# Patient Record
Sex: Female | Born: 2000 | Race: Black or African American | Hispanic: No | Marital: Single | Smoking: Never smoker
Health system: Southern US, Community
[De-identification: ages and names within clinical notes are randomized; demographics above are authoritative.]

## PROBLEM LIST (undated history)

## (undated) DIAGNOSIS — N390 Urinary tract infection, site not specified: Secondary | ICD-10-CM

---
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
## 2021-12-07 ENCOUNTER — Ambulatory Visit (HOSPITAL_COMMUNITY)
Admission: EM | Admit: 2021-12-07 | Discharge: 2021-12-07 | Disposition: A | Discharge status: Home / Self Care | Payer: 59 | Attending: Nurse Practitioner | Admitting: Nurse Practitioner

## 2021-12-07 DIAGNOSIS — F5101 Primary insomnia: Secondary | ICD-10-CM

## 2021-12-07 MED ORDER — HYDROXYZINE HCL 25 MG PO TABS: 25.0000 mg | ORAL_TABLET | Freq: Every evening | ORAL | 0 refills | Status: DC | PRN

## 2021-12-07 MED ORDER — HYDROXYZINE HCL 25 MG PO TABS: 25.0000 mg | ORAL_TABLET | Freq: Every evening | ORAL | Status: DC | PRN

## 2021-12-07 NOTE — BH Assessment (Signed)
Pt reports having diffuclty sleeping for the past month and on average she is sleeping about 3-4 hours a night. Pt reports she lost her great grandmother about a month ago. Patient reports she usually stays up at night thinking about how to better herself and she has thoughts about how her life is not what she wants it to be. Pt has not consulted with her PCP and she has tried taking melatonin to help her sleep. Pt denies SI, HI, AVH, SIB and substance use. Patient denies mental health history and she does not have outaptient services.  Patient mother is in the lobby for support.

## 2021-12-07 NOTE — ED Provider Notes (Signed)
Behavioral Health Urgent Care Medical Screening Exam  [name] MRN: [id number] Date of Evaluation: 12/07/21 Chief Complaint:   Diagnosis:  Final diagnoses:  Primary insomnia    History of Present illness: Carolyn Montgomery is a 21 y.o. female with no prior mental health diagnosis who arrived to this [place] [facility] urgent care with complaints of insomnia. Pt denies other depressive symptoms, states that her great grandmother died a month ago, but she feels as though she is dealing well with this. She states that this is not a stressor for her. 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 She is alert/oriented x 4; calm/cooperative; and mood congruent with affect.  She is speaking in a clear tone at moderate volume, and normal pace; with good eye contact.  Her thought process is coherent and relevant; There is no indication that she is currently responding to internal/external stimuli or experiencing delusional thought content; she has denied suicidal/self-harm/homicidal ideation, psychosis, and paranoia.  Patient has remained calm throughout assessment and has answered questions appropriately.  She denies any substance use/abuse, denies alcohol use, denies all recreational substance use.   Pt has been educated and verbalizes understanding of mental health resources and other crisis services in the community. She is instructed to call 911 and present to the nearest emergency room should she experience any suicidal/homicidal ideation, auditory/visual/hallucinations, or detrimental worsening of her mental health condition.  She has also been advised by Clinical research associate that she could call the toll-free phone on  insurance card to assist with identifying in network counselors and agencies or number on back of insurance card to speak with care coordinator.  Pt provided with a prescription for Vistaril for sleep with no refills, and educated on the need to walk back into this Behavioral center  to establish care for medication management. Pt educated co and establish care prior to the Vistaril running out. She has been educated to present to this center by 7 am on the day that she chooses, and to go to the second floor. Pt verbalized understanding and left accompanied by her mother.  Psychiatric Specialty Exam  Presentation  General Appearance:Appropriate for Environment  Eye Contact:Fair  Speech:Clear and Coherent  Speech Volume:Normal  Handedness:Right   Mood and Affect  Mood:Euthymic Affect:Congruent  Thought Process  Thought Processes:Coherent Descriptions of Associations:Intact  Orientation:Full (Time, Place and Person)  Thought Content:WDL    Hallucinations:None  Ideas of Reference:None  Suicidal Thoughts:No  Homicidal Thoughts:No   Sensorium  Memory:Immediate Good Judgment:Good Insight:Good  Executive Functions  Concentration:Good Attention Span:Good Recall:Good Fund of Knowledge:Good Language:Good  Psychomotor Activity  Psychomotor Activity:Normal  Assets  Assets:Communication Skills; Housing; Social Support  Sleep  Sleep:Poor Number of hours: No data recorded  No data recorded  Physical Exam: Physical Exam Constitutional:      Appearance: Normal appearance.  HENT:     Head: Normocephalic.  Eyes:     Pupils: Pupils are equal, round, and reactive to light.  Pulmonary:     Effort: Pulmonary effort is normal.  Musculoskeletal:        General: Normal range of motion.     Cervical back: Normal range of motion.  Neurological:  Mental Status: She is alert and oriented to person, place, and time.  Psychiatric:        Thought Content: Thought  content normal.    Review of Systems  Constitutional: Negative.   HENT: Negative.    Eyes: Negative.   Respiratory: Negative.    Cardiovascular: Negative.   Gastrointestinal: Negative.   Genitourinary: Negative.   Musculoskeletal: Negative.   Skin: Negative.   Neurological: Negative.   Endo/Heme/Allergies: Negative.   Psychiatric/Behavioral:  Negative for depression, hallucinations, memory loss, substance abuse and suicidal ideas. The patient has insomnia. The patient is not nervous/anxious.    Blood pressure (!) 143/92, pulse 60, temperature 99.5 F (37.5 C), temperature source Oral, resp. rate 18, SpO2 100 %. There is no height or weight on file to calculate BMI.  Musculoskeletal: Strength & Muscle Tone: within normal limits Gait & Station: normal Patient leans: N/A  [facility] MSE Discharge Disposition for Follow up and Recommendations: Based on my evaluation the patient does not appear to have an emergency medical condition and can be discharged with resources and follow up care in outpatient services for Medication Management  Starleen Blue, NP 12/07/2021, 9:38 AM

## 2021-12-07 NOTE — Discharge Instructions (Signed)
On day of arrival, be here by 7 am and go upstairs to the second floor and get a clip board and wait to be seen. Patients are seen on a first come, first served basis.

## 2023-01-10 ENCOUNTER — Emergency Department (HOSPITAL_BASED_OUTPATIENT_CLINIC_OR_DEPARTMENT_OTHER): Payer: Commercial Managed Care - HMO

## 2023-01-10 ENCOUNTER — Other Ambulatory Visit: Payer: Self-pay

## 2023-01-10 ENCOUNTER — Encounter (HOSPITAL_BASED_OUTPATIENT_CLINIC_OR_DEPARTMENT_OTHER): Payer: Self-pay

## 2023-01-10 ENCOUNTER — Inpatient Hospital Stay (HOSPITAL_BASED_OUTPATIENT_CLINIC_OR_DEPARTMENT_OTHER)
Admission: EM | Admit: 2023-01-10 | Discharge: 2023-01-15 | DRG: 444 | Disposition: A | Discharge status: Home / Self Care | Payer: Commercial Managed Care - HMO | Attending: Internal Medicine | Admitting: Internal Medicine

## 2023-01-10 DIAGNOSIS — Z1152 Encounter for screening for COVID-19: Secondary | ICD-10-CM

## 2023-01-10 DIAGNOSIS — J45909 Unspecified asthma, uncomplicated: Secondary | ICD-10-CM | POA: Diagnosis present

## 2023-01-10 DIAGNOSIS — E860 Dehydration: Secondary | ICD-10-CM | POA: Diagnosis present

## 2023-01-10 DIAGNOSIS — I959 Hypotension, unspecified: Secondary | ICD-10-CM | POA: Diagnosis present

## 2023-01-10 DIAGNOSIS — E876 Hypokalemia: Secondary | ICD-10-CM | POA: Diagnosis present

## 2023-01-10 DIAGNOSIS — K8020 Calculus of gallbladder without cholecystitis without obstruction: Principal | ICD-10-CM | POA: Diagnosis present

## 2023-01-10 DIAGNOSIS — R748 Abnormal levels of other serum enzymes: Secondary | ICD-10-CM | POA: Insufficient documentation

## 2023-01-10 DIAGNOSIS — Z8249 Family history of ischemic heart disease and other diseases of the circulatory system: Secondary | ICD-10-CM

## 2023-01-10 DIAGNOSIS — F1290 Cannabis use, unspecified, uncomplicated: Secondary | ICD-10-CM | POA: Diagnosis present

## 2023-01-10 DIAGNOSIS — E871 Hypo-osmolality and hyponatremia: Secondary | ICD-10-CM | POA: Diagnosis present

## 2023-01-10 DIAGNOSIS — E874 Mixed disorder of acid-base balance: Secondary | ICD-10-CM | POA: Diagnosis present

## 2023-01-10 DIAGNOSIS — Z23 Encounter for immunization: Secondary | ICD-10-CM

## 2023-01-10 DIAGNOSIS — F4320 Adjustment disorder, unspecified: Secondary | ICD-10-CM | POA: Diagnosis present

## 2023-01-10 DIAGNOSIS — D72829 Elevated white blood cell count, unspecified: Secondary | ICD-10-CM

## 2023-01-10 DIAGNOSIS — R7401 Elevation of levels of liver transaminase levels: Secondary | ICD-10-CM | POA: Diagnosis present

## 2023-01-10 DIAGNOSIS — Z6831 Body mass index (BMI) 31.0-31.9, adult: Secondary | ICD-10-CM

## 2023-01-10 DIAGNOSIS — E872 Acidosis, unspecified: Secondary | ICD-10-CM | POA: Insufficient documentation

## 2023-01-10 DIAGNOSIS — E878 Other disorders of electrolyte and fluid balance, not elsewhere classified: Secondary | ICD-10-CM | POA: Diagnosis present

## 2023-01-10 DIAGNOSIS — A419 Sepsis, unspecified organism: Secondary | ICD-10-CM

## 2023-01-10 DIAGNOSIS — R112 Nausea with vomiting, unspecified: Secondary | ICD-10-CM | POA: Diagnosis present

## 2023-01-10 DIAGNOSIS — K3189 Other diseases of stomach and duodenum: Secondary | ICD-10-CM | POA: Diagnosis present

## 2023-01-10 DIAGNOSIS — K859 Acute pancreatitis without necrosis or infection, unspecified: Secondary | ICD-10-CM | POA: Diagnosis present

## 2023-01-10 DIAGNOSIS — E873 Alkalosis: Secondary | ICD-10-CM

## 2023-01-10 DIAGNOSIS — N179 Acute kidney failure, unspecified: Principal | ICD-10-CM | POA: Diagnosis present

## 2023-01-10 DIAGNOSIS — E669 Obesity, unspecified: Secondary | ICD-10-CM | POA: Diagnosis present

## 2023-01-10 DIAGNOSIS — R9431 Abnormal electrocardiogram [ECG] [EKG]: Secondary | ICD-10-CM | POA: Diagnosis present

## 2023-01-10 LAB — I-STAT VENOUS BLOOD GAS, ED
Acid-Base Excess: 10 mmol/L — ABNORMAL HIGH (ref 0.0–2.0)
Bicarbonate: 30.2 mmol/L — ABNORMAL HIGH (ref 20.0–28.0)
Calcium, Ion: 1.11 mmol/L — ABNORMAL LOW (ref 1.15–1.40)
HCT: 54 % — ABNORMAL HIGH (ref 36.0–46.0)
Hemoglobin: 18.4 g/dL — ABNORMAL HIGH (ref 12.0–15.0)
O2 Saturation: 82 %
Potassium: 2.6 mmol/L — CL (ref 3.5–5.1)
Sodium: 134 mmol/L — ABNORMAL LOW (ref 135–145)
TCO2: 31 mmol/L (ref 22–32)
pCO2, Ven: 28.1 mm[Hg] — ABNORMAL LOW (ref 44–60)
pH, Ven: 7.639 (ref 7.25–7.43)
pO2, Ven: 36 mm[Hg] (ref 32–45)

## 2023-01-10 LAB — URINALYSIS, W/ REFLEX TO CULTURE (INFECTION SUSPECTED)
Bilirubin Urine: NEGATIVE
Glucose, UA: NEGATIVE mg/dL
Hgb urine dipstick: NEGATIVE
Ketones, ur: 15 mg/dL — AB
Leukocytes,Ua: NEGATIVE
Nitrite: NEGATIVE
Protein, ur: 30 mg/dL — AB
Specific Gravity, Urine: 1.026 (ref 1.005–1.030)
pH: 6.5 (ref 5.0–8.0)

## 2023-01-10 LAB — CBC
HCT: 48.6 % — ABNORMAL HIGH (ref 36.0–46.0)
Hemoglobin: 17.5 g/dL — ABNORMAL HIGH (ref 12.0–15.0)
MCH: 31.6 pg (ref 26.0–34.0)
MCHC: 36 g/dL (ref 30.0–36.0)
MCV: 87.7 fL (ref 80.0–100.0)
Platelets: 425 10*3/uL — ABNORMAL HIGH (ref 150–400)
RBC: 5.54 MIL/uL — ABNORMAL HIGH (ref 3.87–5.11)
RDW: 17.1 % — ABNORMAL HIGH (ref 11.5–15.5)
WBC: 11.5 10*3/uL — ABNORMAL HIGH (ref 4.0–10.5)
nRBC: 0 % (ref 0.0–0.2)

## 2023-01-10 LAB — APTT: aPTT: 27 s (ref 24–36)

## 2023-01-10 LAB — COMPREHENSIVE METABOLIC PANEL
ALT: 311 U/L — ABNORMAL HIGH (ref 0–44)
AST: 109 U/L — ABNORMAL HIGH (ref 15–41)
Albumin: 4.9 g/dL (ref 3.5–5.0)
Alkaline Phosphatase: 69 U/L (ref 38–126)
Anion gap: 23 — ABNORMAL HIGH (ref 5–15)
BUN: 24 mg/dL — ABNORMAL HIGH (ref 6–20)
CO2: 22 mmol/L (ref 22–32)
Calcium: 10.6 mg/dL — ABNORMAL HIGH (ref 8.9–10.3)
Chloride: 88 mmol/L — ABNORMAL LOW (ref 98–111)
Creatinine, Ser: 1.63 mg/dL — ABNORMAL HIGH (ref 0.44–1.00)
GFR, Estimated: 45 mL/min — ABNORMAL LOW (ref >60)
Glucose, Bld: 165 mg/dL — ABNORMAL HIGH (ref 70–99)
Potassium: 2.7 mmol/L — CL (ref 3.5–5.1)
Sodium: 133 mmol/L — ABNORMAL LOW (ref 135–145)
Total Bilirubin: 0.9 mg/dL (ref 0.3–1.2)
Total Protein: 8.8 g/dL — ABNORMAL HIGH (ref 6.5–8.1)

## 2023-01-10 LAB — LACTIC ACID, PLASMA
Lactic Acid, Venous: 1.4 mmol/L (ref 0.5–1.9)
Lactic Acid, Venous: 2.6 mmol/L (ref 0.5–1.9)

## 2023-01-10 LAB — SARS CORONAVIRUS 2 BY RT PCR: SARS Coronavirus 2 by RT PCR: NEGATIVE

## 2023-01-10 LAB — MAGNESIUM: Magnesium: 2.7 mg/dL — ABNORMAL HIGH (ref 1.7–2.4)

## 2023-01-10 LAB — LIPASE, BLOOD: Lipase: 261 U/L — ABNORMAL HIGH (ref 11–51)

## 2023-01-10 LAB — PROTIME-INR
INR: 1 (ref 0.8–1.2)
Prothrombin Time: 13.1 s (ref 11.4–15.2)

## 2023-01-10 LAB — RESP PANEL BY RT-PCR (RSV, FLU A&B, COVID)  RVPGX2
Influenza A by PCR: NEGATIVE
Influenza B by PCR: NEGATIVE
Resp Syncytial Virus by PCR: NEGATIVE
SARS Coronavirus 2 by RT PCR: NEGATIVE

## 2023-01-10 LAB — HCG, SERUM, QUALITATIVE: Preg, Serum: NEGATIVE

## 2023-01-10 LAB — ACETAMINOPHEN LEVEL: Acetaminophen (Tylenol), Serum: <10 ug/mL — ABNORMAL LOW (ref 10–30)

## 2023-01-10 LAB — ETHANOL: Alcohol, Ethyl (B): <10 mg/dL (ref <10)

## 2023-01-10 LAB — SALICYLATE LEVEL: Salicylate Lvl: <7 mg/dL — ABNORMAL LOW (ref 7.0–30.0)

## 2023-01-10 LAB — TSH: TSH: 0.727 u[IU]/mL (ref 0.350–4.500)

## 2023-01-10 MED ORDER — LACTATED RINGERS IV SOLN: INTRAVENOUS | Status: DC

## 2023-01-10 MED ORDER — LACTATED RINGERS IV BOLUS
1000.0000 mL | Freq: Once | INTRAVENOUS | Status: AC
  Administered 2023-01-10: 1000 mL via INTRAVENOUS

## 2023-01-10 MED ORDER — METRONIDAZOLE 500 MG/100ML IV SOLN
500.0000 mg | Freq: Once | INTRAVENOUS | Status: AC
  Administered 2023-01-10: 500 mg via INTRAVENOUS
  Filled 2023-01-10: qty 100

## 2023-01-10 MED ORDER — FAMOTIDINE IN NACL 20-0.9 MG/50ML-% IV SOLN
20.0000 mg | Freq: Once | INTRAVENOUS | Status: AC
  Administered 2023-01-10: 20 mg via INTRAVENOUS
  Filled 2023-01-10: qty 50

## 2023-01-10 MED ORDER — SODIUM CHLORIDE 0.9 % IV SOLN
2.0000 g | Freq: Once | INTRAVENOUS | Status: AC
  Administered 2023-01-10: 2 g via INTRAVENOUS
  Filled 2023-01-10: qty 20

## 2023-01-10 MED ORDER — IOHEXOL 300 MG/ML  SOLN
100.0000 mL | Freq: Once | INTRAMUSCULAR | Status: AC | PRN
  Administered 2023-01-10: 100 mL via INTRAVENOUS

## 2023-01-10 MED ORDER — ONDANSETRON 4 MG PO TBDP
4.0000 mg | ORAL_TABLET | Freq: Once | ORAL | Status: AC | PRN
  Administered 2023-01-10: 4 mg via ORAL
  Filled 2023-01-10: qty 1

## 2023-01-10 MED ORDER — LACTATED RINGERS IV BOLUS (SEPSIS)
1000.0000 mL | Freq: Once | INTRAVENOUS | Status: AC
  Administered 2023-01-10: 1000 mL via INTRAVENOUS

## 2023-01-10 MED ORDER — ONDANSETRON HCL 4 MG/2ML IJ SOLN
4.0000 mg | Freq: Once | INTRAMUSCULAR | Status: AC
  Administered 2023-01-10: 4 mg via INTRAVENOUS
  Filled 2023-01-10: qty 2

## 2023-01-10 MED ORDER — POTASSIUM CHLORIDE 10 MEQ/100ML IV SOLN
10.0000 meq | INTRAVENOUS | Status: AC
  Administered 2023-01-10 – 2023-01-11 (×5): 10 meq via INTRAVENOUS
  Filled 2023-01-10 (×5): qty 100

## 2023-01-10 NOTE — ED Notes (Signed)
RT Note: VBG completed and handed to Dr. Dalene Seltzer.

## 2023-01-10 NOTE — ED Triage Notes (Signed)
Pt presents with N/V, decreased appetite, and fatigue x 1 week. Pt has taken PeptoBismol without relief.

## 2023-01-10 NOTE — ED Notes (Signed)
Due to sepsis protocol, imaging was requested to remain on hold for nursing staff to perform necessary actions. Will perform imaging after nurse gives all clear.

## 2023-01-10 NOTE — Plan of Care (Signed)
Plan of Care Note for accepted transfer   [name] ZOX:[id number] DOB: 12-19-2000  Facility requesting transfer: [facility] ED Requesting Provider: Dr. Dalene Seltzer Facility course: 22 year old female with history of asthma, adjustment disorder presented to ED with complaints of nausea, vomiting, and epigastric abdominal pain.  Tachycardic on arrival but afebrile.  Labs showing WBC 11.5, hemoglobin 17.5, platelet count 425k, sodium 133, potassium 2.7, magnesium 2.7, chloride 88, glucose 165, BUN 24, creatinine 1.6 (baseline 1.0), calcium 10.6, albumin 4.9, AST 109, ALT 311, alk phos and T. bili normal, lipase 261, COVID/influenza/RSV PCR negative, blood cultures collected, lactic acid 1.4> 2.6, salicylate level <7.0, acetaminophen level <10, blood alcohol level <10, serum pregnancy test negative, TSH normal.  UA with negative nitrite, negative leukocytes, and microscopy showing 11-20 WBCs and rare bacteria.  VBG showing pH 7.63 and pCO2 28.  Chest x-ray showing no active disease.  Right upper quadrant abdominal ultrasound showing cholelithiasis without sonographic evidence of acute cholecystitis.  CT abdomen pelvis showing gallstones and no acute abnormality.  EKG showing QT prolongation. Patient was given Zofran, ceftriaxone, metronidazole, Pepcid, 4 L IV fluid boluses, and IV potassium in the ED.  Plan of care: The patient is accepted for admission to Progressive unit at [facility].  [facility] will assume care on arrival to accepting facility. Until arrival, care as per EDP. However, [facility] available 24/7 for questions and assistance.  Author: John Giovanni, MD 11/18/2022  Check [email or web address] for on-call coverage.  Nursing staff, please call [facility] Admits & Consults System-Wide number under Amion on patient's arrival so appropriate admitting provider can evaluate the pt.

## 2023-01-10 NOTE — Sepsis Progress Note (Signed)
Code Sepsis protocol being monitored by eLink. 

## 2023-01-10 NOTE — Sepsis Progress Note (Signed)
Notified provider of need to order 3rd lactic acid.

## 2023-01-10 NOTE — ED Provider Notes (Signed)
[place] EMERGENCY DEPARTMENT AT [facility] Provider Note   CSN: [id number] Arrival date & time: 01/10/23  1535     History  No chief complaint on file.   Carolyn Montgomery is a 22 y.o. female history of adjustment disorder presented for nausea and vomiting over the past 1 week.  Mom is present to assist in history.  Both state the patient has had nonstop emesis and has not been eating due to the emesis.  Mom states that patient sleeps all the time and has not appeared energetic.  Patient is tried Pepto-Bismol without relief along with Tylenol and ibuprofen but says she is only taken a few pills of these.  Patient that she has not had alcohol in a few weeks.  Patient denies illicit drug use.  Patient denies sick contacts.  Patient denies any diarrhea.  Patient is unable to say whether or not she has abdominal pain.  Patient denies chest pain, shortness of breath, dysuria, vaginal bleeding, neck stiffness, headache  Home Medications Prior to Admission medications   Medication Sig Start Date End Date Taking? Authorizing Provider  hydrOXYzine (ATARAX) 25 MG tablet Take 1 tablet (25 mg total) by mouth at bedtime as needed (insomnia). 12/07/21   Starleen Blue, NP      Allergies    Patient has no known allergies.    Review of Systems   Review of Systems  Physical Exam Updated Vital Signs BP 117/79   Pulse 83   Temp 98.8 F (37.1 C) (Oral)   Resp 17   Ht 5' 0.5" (1.537 m)   Wt 86.2 kg   LMP  (LMP Unknown)   SpO2 98%   BMI 36.50 kg/m  Physical Exam Constitutional:      General: She is in acute distress.     Appearance: She is ill-appearing and toxic-appearing.     Comments: Lethargic  HENT:     Mouth/Throat:     Mouth: Mucous membranes are dry.  Eyes:     General: No scleral icterus.    Extraocular Movements: Extraocular movements intact.     Conjunctiva/sclera: Conjunctivae normal.     Pupils: Pupils are equal, round, and reactive to light.  Cardiovascular:      Rate and Rhythm: Regular rhythm. Tachycardia present.     Pulses: Normal pulses.     Heart sounds: Normal heart sounds.  Pulmonary:     Effort: Pulmonary effort is normal.     Breath sounds: Normal breath sounds.  Abdominal:     General: There is no distension.     Palpations: Abdomen is soft.     Tenderness: There is no abdominal tenderness. There is no guarding or rebound.  Musculoskeletal:     Cervical back: Normal range of motion. No rigidity or tenderness.     Comments: Able to move all 4 extremities however does appear weak  Skin:    General: Skin is warm and dry.     Capillary Refill: Capillary refill takes 2 to 3 seconds.     Coloration: Skin is not jaundiced.  Neurological:     Mental Status: She is alert.     Comments: Appears lethargic Sensation intact in all 4 limbs     ED Results / Procedures / Treatments   Labs (all labs ordered are listed, but only abnormal results are displayed) Labs Reviewed  LIPASE, BLOOD - Abnormal; Notable for the following components:      Result Value   Lipase 261 (*)  All other components within normal limits  COMPREHENSIVE METABOLIC PANEL - Abnormal; Notable for the following components:   Sodium 133 (*)    Potassium 2.7 (*)    Chloride 88 (*)    Glucose, Bld 165 (*)    BUN 24 (*)    Creatinine, Ser 1.63 (*)    Calcium 10.6 (*)    Total Protein 8.8 (*)    AST 109 (*)    ALT 311 (*)    GFR, Estimated 45 (*)    Anion gap 23 (*)    All other components within normal limits  CBC - Abnormal; Notable for the following components:   WBC 11.5 (*)    RBC 5.54 (*)    Hemoglobin 17.5 (*)    HCT 48.6 (*)    RDW 17.1 (*)    Platelets 425 (*)    All other components within normal limits  SALICYLATE LEVEL - Abnormal; Notable for the following components:   Salicylate Lvl <7.0 (*)    All other components within normal limits  ACETAMINOPHEN LEVEL - Abnormal; Notable for the following components:   Acetaminophen (Tylenol), Serum  <10 (*)    All other components within normal limits  I-STAT VENOUS BLOOD GAS, ED - Abnormal; Notable for the following components:   pH, Ven 7.639 (*)    pCO2, Ven 28.1 (*)    Bicarbonate 30.2 (*)    Acid-Base Excess 10.0 (*)    Sodium 134 (*)    Potassium 2.6 (*)    Calcium, Ion 1.11 (*)    HCT 54.0 (*)    Hemoglobin 18.4 (*)    All other components within normal limits  SARS CORONAVIRUS 2 BY RT PCR  RESP PANEL BY RT-PCR (RSV, FLU A&B, COVID)  RVPGX2  CULTURE, BLOOD (ROUTINE X 2)  CULTURE, BLOOD (ROUTINE X 2)  LACTIC ACID, PLASMA  PROTIME-INR  APTT  ETHANOL  HCG, SERUM, QUALITATIVE  PREGNANCY, URINE  LACTIC ACID, PLASMA  URINALYSIS, W/ REFLEX TO CULTURE (INFECTION SUSPECTED)  MAGNESIUM    EKG EKG Interpretation Date/Time:  Friday January 10 2023 16:48:53 EDT Ventricular Rate:  59 PR Interval:  115 QRS Duration:  110 QT Interval:  543 QTC Calculation: 538 R Axis:   61  Text Interpretation: Sinus rhythm Borderline short PR interval Prolonged QT interval Nonspecific ST changes Confirmed by Alvira Monday ([id number]) on 01/10/2023 5:57:14 PM  Radiology DG Chest Port 1 View  Result Date: 01/10/2023 CLINICAL DATA:  Possible sepsis EXAM: PORTABLE CHEST 1 VIEW COMPARISON:  12/15/2011 FINDINGS: The heart size and mediastinal contours are within normal limits. Both lungs are clear. The visualized skeletal structures are unremarkable. IMPRESSION: No active disease. Electronically Signed   By: Jasmine Pang M.D.   On: 01/10/2023 18:30    Procedures .Critical Care  Performed by: Netta Corrigan, PA-C Authorized by: Netta Corrigan, PA-C   Critical care provider statement:    Critical care time (minutes):  30   Critical care was necessary to treat or prevent imminent or life-threatening deterioration of the following conditions:  Sepsis   Critical care was time spent personally by me on the following activities:  Development of treatment plan with patient or surrogate,  discussions with consultants, evaluation of patient's response to treatment, examination of patient, ordering and review of laboratory studies, ordering and review of radiographic studies, ordering and performing treatments and interventions, pulse oximetry, re-evaluation of patient's condition, review of old charts, blood draw for specimens and obtaining history from patient or  surrogate   I assumed direction of critical care for this patient from another provider in my specialty: no     Care discussed with comment:  Sign out to next provider     Medications Ordered in ED Medications  lactated ringers infusion (has no administration in time range)  lactated ringers bolus 1,000 mL (1,000 mLs Intravenous New Bag/Given 01/10/23 1805)    And  lactated ringers bolus 1,000 mL (1,000 mLs Intravenous New Bag/Given 01/10/23 1806)    And  lactated ringers bolus 1,000 mL (has no administration in time range)  metroNIDAZOLE (FLAGYL) IVPB 500 mg (500 mg Intravenous New Bag/Given 01/10/23 1805)  famotidine (PEPCID) IVPB 20 mg premix (20 mg Intravenous New Bag/Given 01/10/23 1832)  potassium chloride 10 mEq in 100 mL IVPB (10 mEq Intravenous New Bag/Given 01/10/23 1832)  ondansetron (ZOFRAN-ODT) disintegrating tablet 4 mg (4 mg Oral Given 01/10/23 1548)  cefTRIAXone (ROCEPHIN) 2 g in sodium chloride 0.9 % 100 mL IVPB (0 g Intravenous Stopped 01/10/23 1835)  ondansetron (ZOFRAN) injection 4 mg (4 mg Intravenous Given 01/10/23 1824)    ED Course/ Medical Decision Making/ A&P                                 Medical Decision Making Amount and/or Complexity of Data Reviewed Labs: ordered. Radiology: ordered. ECG/medicine tests: ordered.  Risk Prescription drug management.   Carolyn Montgomery 21 y.o. presented today for sepsis.  Working DDx that I considered at this time includes, but not limited to, sepsis, bacteremia, UTI, pneumonia, meningitis/encephalitis, cellulitis, ACS, myocarditis, acidosis,  dehydration, electrolyte abnormalities.  R/o DDx: Pending  Review of prior external notes: 12/07/2021 ED  Unique Tests and My Interpretation: CBC: Leukocytosis 11.5 CMP: Hypokalemia 2.7, chloride 88, sodium 133, creatinine 1.63, BUN 24, transaminitis 109/311, GFR 45, anion gap 23 Lactic acid: 1.4 Magnesium: pending Tylenol: Unremarkable Salicylate level:Unremarkable Ethanol:Unremarkable Lipase: 261 UA: Pending Chest x-ray: No acute cardiopulmonary changes aPTT: Unremarkable PT/INR: Unremarkable Blood cultures: Pending Respiratory panel: Negative EKG: Sinus 59 bpm, prolonged QT 543/538  Discussion with Independent Historian:  Mother  Discussion of Management of Tests: None  Risk: High: Hospitalization  Risk Stratification Score: none  Plan: On exam patient was septic on arrival.  On exam patient appeared lethargic and was tachycardic at 107.  Patient did appear dry on exam as well which I suspect is from all of her nausea and vomiting.  Patient did not have any abdominal tenderness on exam and did not endorse history of right upper quadrant or right lower quadrant pain but abdominal labs from triage do show a lipase of 261 along with transaminitis of 109/311 along with leukocytosis 11.5.  Patient was also severely hypokalemic at 2.7 and will be replenished through the IV as she cannot tolerate p.o. at this time.  Patient does have AKI as well along with elevated anion gap which I suspect is secondary to her dehydration however patient does endorse using Pepto-Bismol and so salicylate level and Tylenol levels were ordered along with ethanol given patient's history.  Sepsis workup was initiated and ultrasound be ordered to look at patient's gallbladder if negative will get CT to further rule out other pathology however suspect patient is severely dehydrated and will need admission.  Patient was having active emesis that was nonbloody in the room and although she was given Zofran in  triage will give Zofran through the IV to help.  EKG came back  and patient does have prolonged QT and so will withhold further QT prolonging medications.  May consider Tigan if nausea and emesis does not improve.  Patient does have metabolic alkalosis which I suspect is secondary to her emesis and contributing to her weakness and fatigue.  Patient's vitals have improved with fluids and patient is no longer tachycardic.  Patient signed out to [facility], PA-C.  Please review their note for the continuation of patient's care.  The plan at this point is admit for AKI and to follow-up on ultrasound and CT.  This chart was dictated using voice recognition software.  Despite best efforts to proofread,  errors can occur which can change the documentation meaning.         Final Clinical Impression(s) / ED Diagnoses Final diagnoses:  AKI (acute kidney injury) (HCC)  Metabolic alkalosis    Rx / DC Orders ED Discharge Orders     None         Remi Deter 01/10/23 1839    Alvira Monday, MD 01/11/23 [phone]

## 2023-01-10 NOTE — ED Notes (Signed)
Pt is unable to give a urine sample, will try to ask again at a later time.

## 2023-01-11 ENCOUNTER — Encounter (HOSPITAL_COMMUNITY): Payer: Self-pay | Admitting: Internal Medicine

## 2023-01-11 DIAGNOSIS — E860 Dehydration: Secondary | ICD-10-CM | POA: Diagnosis not present

## 2023-01-11 DIAGNOSIS — R748 Abnormal levels of other serum enzymes: Secondary | ICD-10-CM | POA: Insufficient documentation

## 2023-01-11 DIAGNOSIS — I959 Hypotension, unspecified: Secondary | ICD-10-CM | POA: Diagnosis not present

## 2023-01-11 DIAGNOSIS — R7401 Elevation of levels of liver transaminase levels: Secondary | ICD-10-CM | POA: Diagnosis not present

## 2023-01-11 DIAGNOSIS — J45909 Unspecified asthma, uncomplicated: Secondary | ICD-10-CM | POA: Diagnosis not present

## 2023-01-11 DIAGNOSIS — Z6831 Body mass index (BMI) 31.0-31.9, adult: Secondary | ICD-10-CM | POA: Diagnosis not present

## 2023-01-11 DIAGNOSIS — Z8249 Family history of ischemic heart disease and other diseases of the circulatory system: Secondary | ICD-10-CM | POA: Diagnosis not present

## 2023-01-11 DIAGNOSIS — E874 Mixed disorder of acid-base balance: Secondary | ICD-10-CM | POA: Diagnosis not present

## 2023-01-11 DIAGNOSIS — Z23 Encounter for immunization: Secondary | ICD-10-CM | POA: Diagnosis not present

## 2023-01-11 DIAGNOSIS — E871 Hypo-osmolality and hyponatremia: Secondary | ICD-10-CM | POA: Diagnosis not present

## 2023-01-11 DIAGNOSIS — D72829 Elevated white blood cell count, unspecified: Secondary | ICD-10-CM

## 2023-01-11 DIAGNOSIS — E873 Alkalosis: Secondary | ICD-10-CM

## 2023-01-11 DIAGNOSIS — N179 Acute kidney failure, unspecified: Secondary | ICD-10-CM | POA: Diagnosis not present

## 2023-01-11 DIAGNOSIS — R9431 Abnormal electrocardiogram [ECG] [EKG]: Secondary | ICD-10-CM | POA: Diagnosis not present

## 2023-01-11 DIAGNOSIS — E878 Other disorders of electrolyte and fluid balance, not elsewhere classified: Secondary | ICD-10-CM | POA: Insufficient documentation

## 2023-01-11 DIAGNOSIS — E872 Acidosis, unspecified: Secondary | ICD-10-CM | POA: Insufficient documentation

## 2023-01-11 DIAGNOSIS — K3189 Other diseases of stomach and duodenum: Secondary | ICD-10-CM | POA: Diagnosis not present

## 2023-01-11 DIAGNOSIS — A419 Sepsis, unspecified organism: Secondary | ICD-10-CM

## 2023-01-11 DIAGNOSIS — E876 Hypokalemia: Secondary | ICD-10-CM | POA: Diagnosis not present

## 2023-01-11 DIAGNOSIS — Z1152 Encounter for screening for COVID-19: Secondary | ICD-10-CM | POA: Diagnosis not present

## 2023-01-11 DIAGNOSIS — R112 Nausea with vomiting, unspecified: Secondary | ICD-10-CM | POA: Diagnosis not present

## 2023-01-11 DIAGNOSIS — K859 Acute pancreatitis without necrosis or infection, unspecified: Secondary | ICD-10-CM | POA: Diagnosis not present

## 2023-01-11 DIAGNOSIS — K8020 Calculus of gallbladder without cholecystitis without obstruction: Secondary | ICD-10-CM | POA: Diagnosis not present

## 2023-01-11 DIAGNOSIS — F1290 Cannabis use, unspecified, uncomplicated: Secondary | ICD-10-CM | POA: Diagnosis not present

## 2023-01-11 DIAGNOSIS — E669 Obesity, unspecified: Secondary | ICD-10-CM | POA: Diagnosis not present

## 2023-01-11 DIAGNOSIS — F4320 Adjustment disorder, unspecified: Secondary | ICD-10-CM | POA: Diagnosis not present

## 2023-01-11 LAB — CBC
HCT: 34.8 % — ABNORMAL LOW (ref 36.0–46.0)
Hemoglobin: 11.8 g/dL — ABNORMAL LOW (ref 12.0–15.0)
MCH: 31.3 pg (ref 26.0–34.0)
MCHC: 33.9 g/dL (ref 30.0–36.0)
MCV: 92.3 fL (ref 80.0–100.0)
Platelets: 213 10*3/uL (ref 150–400)
RBC: 3.77 MIL/uL — ABNORMAL LOW (ref 3.87–5.11)
RDW: 16.7 % — ABNORMAL HIGH (ref 11.5–15.5)
WBC: 8.1 10*3/uL (ref 4.0–10.5)
nRBC: 0 % (ref 0.0–0.2)

## 2023-01-11 LAB — COMPREHENSIVE METABOLIC PANEL
ALT: 171 U/L — ABNORMAL HIGH (ref 0–44)
AST: 58 U/L — ABNORMAL HIGH (ref 15–41)
Albumin: 2.9 g/dL — ABNORMAL LOW (ref 3.5–5.0)
Alkaline Phosphatase: 45 U/L (ref 38–126)
Anion gap: 11 (ref 5–15)
BUN: 16 mg/dL (ref 6–20)
CO2: 26 mmol/L (ref 22–32)
Calcium: 8.5 mg/dL — ABNORMAL LOW (ref 8.9–10.3)
Chloride: 97 mmol/L — ABNORMAL LOW (ref 98–111)
Creatinine, Ser: 1.14 mg/dL — ABNORMAL HIGH (ref 0.44–1.00)
GFR, Estimated: >60 mL/min (ref >60)
Glucose, Bld: 106 mg/dL — ABNORMAL HIGH (ref 70–99)
Potassium: 2.6 mmol/L — CL (ref 3.5–5.1)
Sodium: 134 mmol/L — ABNORMAL LOW (ref 135–145)
Total Bilirubin: 0.8 mg/dL (ref 0.3–1.2)
Total Protein: 5.5 g/dL — ABNORMAL LOW (ref 6.5–8.1)

## 2023-01-11 LAB — RAPID URINE DRUG SCREEN, HOSP PERFORMED
Amphetamines: NOT DETECTED
Barbiturates: NOT DETECTED
Benzodiazepines: NOT DETECTED
Cocaine: NOT DETECTED
Opiates: NOT DETECTED
Tetrahydrocannabinol: POSITIVE — AB

## 2023-01-11 LAB — T4, FREE: Free T4: 1.28 ng/dL — ABNORMAL HIGH (ref 0.61–1.12)

## 2023-01-11 LAB — HIV ANTIBODY (ROUTINE TESTING W REFLEX): HIV Screen 4th Generation wRfx: NONREACTIVE

## 2023-01-11 LAB — HEPATITIS PANEL, ACUTE
HCV Ab: NONREACTIVE
Hep A IgM: NONREACTIVE
Hep B C IgM: NONREACTIVE
Hepatitis B Surface Ag: NONREACTIVE

## 2023-01-11 LAB — LACTIC ACID, PLASMA: Lactic Acid, Venous: 1.2 mmol/L (ref 0.5–1.9)

## 2023-01-11 LAB — PHOSPHORUS: Phosphorus: 2.5 mg/dL (ref 2.5–4.6)

## 2023-01-11 MED ORDER — ACETAMINOPHEN 325 MG PO TABS
650.0000 mg | ORAL_TABLET | Freq: Four times a day (QID) | ORAL | Status: DC | PRN
  Administered 2023-01-11: 650 mg via ORAL
  Filled 2023-01-11: qty 2

## 2023-01-11 MED ORDER — SODIUM CHLORIDE 0.9 % IV SOLN
250.0000 mL | INTRAVENOUS | Status: DC | PRN
  Administered 2023-01-11: 250 mL via INTRAVENOUS

## 2023-01-11 MED ORDER — INFLUENZA VIRUS VACC SPLIT PF (FLUZONE) 0.5 ML IM SUSY
0.5000 mL | PREFILLED_SYRINGE | INTRAMUSCULAR | Status: AC | PRN
  Administered 2023-01-12: 0.5 mL via INTRAMUSCULAR
  Filled 2023-01-11: qty 0.5

## 2023-01-11 MED ORDER — ONDANSETRON HCL 4 MG/2ML IJ SOLN
4.0000 mg | Freq: Four times a day (QID) | INTRAMUSCULAR | Status: DC | PRN
  Administered 2023-01-11 (×2): 4 mg via INTRAVENOUS
  Filled 2023-01-11 (×3): qty 2

## 2023-01-11 MED ORDER — ACETAMINOPHEN 650 MG RE SUPP: 650.0000 mg | Freq: Four times a day (QID) | RECTAL | Status: DC | PRN

## 2023-01-11 MED ORDER — SODIUM CHLORIDE 0.9 % IV SOLN: 2.0000 g | INTRAVENOUS | Status: DC

## 2023-01-11 MED ORDER — ENOXAPARIN SODIUM 40 MG/0.4ML IJ SOSY
40.0000 mg | PREFILLED_SYRINGE | INTRAMUSCULAR | Status: DC
  Administered 2023-01-11 – 2023-01-15 (×5): 40 mg via SUBCUTANEOUS
  Filled 2023-01-11 (×5): qty 0.4

## 2023-01-11 MED ORDER — ONDANSETRON HCL 4 MG PO TABS: 4.0000 mg | ORAL_TABLET | Freq: Four times a day (QID) | ORAL | Status: DC | PRN

## 2023-01-11 MED ORDER — POTASSIUM CHLORIDE 10 MEQ/100ML IV SOLN
INTRAVENOUS | Status: AC
  Filled 2023-01-11: qty 100

## 2023-01-11 MED ORDER — ORAL CARE MOUTH RINSE: 15.0000 mL | OROMUCOSAL | Status: DC | PRN

## 2023-01-11 MED ORDER — PANTOPRAZOLE SODIUM 40 MG PO TBEC
40.0000 mg | DELAYED_RELEASE_TABLET | Freq: Every day | ORAL | Status: DC
  Administered 2023-01-11 – 2023-01-12 (×2): 40 mg via ORAL
  Filled 2023-01-11 (×2): qty 1

## 2023-01-11 MED ORDER — POTASSIUM CHLORIDE 20 MEQ PO PACK: 80.0000 meq | PACK | Freq: Once | ORAL | Status: DC

## 2023-01-11 MED ORDER — ENOXAPARIN SODIUM 40 MG/0.4ML IJ SOSY: 40.0000 mg | PREFILLED_SYRINGE | INTRAMUSCULAR | Status: DC

## 2023-01-11 MED ORDER — SODIUM CHLORIDE 0.9% FLUSH: 3.0000 mL | INTRAVENOUS | Status: DC | PRN

## 2023-01-11 MED ORDER — POTASSIUM CHLORIDE 10 MEQ/100ML IV SOLN: 10.0000 meq | INTRAVENOUS | Status: DC

## 2023-01-11 MED ORDER — SODIUM CHLORIDE 0.9% FLUSH
3.0000 mL | Freq: Two times a day (BID) | INTRAVENOUS | Status: DC
  Administered 2023-01-11 – 2023-01-15 (×8): 3 mL via INTRAVENOUS

## 2023-01-11 MED ORDER — METOCLOPRAMIDE HCL 5 MG/ML IJ SOLN
10.0000 mg | Freq: Once | INTRAMUSCULAR | Status: AC
  Administered 2023-01-11: 10 mg via INTRAVENOUS
  Filled 2023-01-11: qty 2

## 2023-01-11 MED ORDER — LACTATED RINGERS IV SOLN: INTRAVENOUS | Status: AC

## 2023-01-11 MED ORDER — METRONIDAZOLE 500 MG/100ML IV SOLN: 500.0000 mg | Freq: Two times a day (BID) | INTRAVENOUS | Status: DC

## 2023-01-11 MED ORDER — POTASSIUM CHLORIDE 10 MEQ/100ML IV SOLN
10.0000 meq | INTRAVENOUS | Status: DC
  Administered 2023-01-11 (×3): 10 meq via INTRAVENOUS
  Filled 2023-01-11 (×2): qty 100

## 2023-01-11 MED ORDER — POTASSIUM CHLORIDE 10 MEQ/100ML IV SOLN
10.0000 meq | INTRAVENOUS | Status: AC
  Administered 2023-01-11 (×6): 10 meq via INTRAVENOUS
  Filled 2023-01-11 (×6): qty 100

## 2023-01-11 NOTE — H&P (Addendum)
History and Physical    Carolyn Montgomery WGN:[id number] DOB: 03/01/2001 DOA: 01/10/2023  PCP: Pcp, No   Patient coming from: Home   Chief Complaint: Nausea and vomiting   HPI:  Carolyn Montgomery is a 22 y.o. female with medical history significant of adjustment disorder and asthma presented to emergency department for complaint of vomiting for 1 week.  Patient's mom at the bedside who helped with the history.  Patient reported that she has nonbilious and nonbloody vomiting for last 1 week which has been gradually improving however she has poor oral tolerance and has very low energy.  Patient denies any abdominal pain, diarrhea, fever and chills.  Denies any chest pain, shortness of breath, palpitation and headache.  Denies any no known sick contact.  Patient reported she tried Pepto-Bismol at home without any improvement of nausea and vomiting. Denies any use of illicit drug and smoking marijuana.   ED Course:  At your initial presentation patient found tachycardic 107, respiratory 15, blood pressure 121/97.  O2 sat 100% room air. COVID-negative. Elevated lipase 261. CMP showed low sodium 133, low potassium 2.3, chloride low 88, blood glucose 165, elevated BUN 24, elevated creatinine 1.63, elevated calcium 10.6, elevated AST 109, elevated AST 311, GFR 45 and elevated anion gap 23. CBC showing leukocytosis 11.5, elevated RBC 5.54, hemoglobin 17, hematocrit 48 and elevated platelet 425. Respiratory panel negative. Blood cultures are in process.  UA showed ketones, protein and few bacteria. Normal ethanol level.  Normal acetaminophen and salicylate level. Pregnancy test negative.   Low magnesium 2.7. TSH within normal range. Elevated lactic acid 2.6.  CT abdomen pelvis no acute intra-abdominal or intrapelvic abnormality which showed cholelithiasis.  Right upper quadrant ultrasound cholelithiasis without any evidence of acute cholecystitis.  X-ray no acute disease process.  In the  ED patient has been started treatment with ceftriaxone 1 g, blood Zofran.  Famotidine 20 mg, 4 L of LR bolus and running LR 150 cc/h.    Patient [facility] for evaluation management of nausea and vomiting, several electrolyte derangement, and transaminitis.  Review of Systems:  Review of Systems  Constitutional:  Negative for chills, fever, malaise/fatigue and weight loss.  Respiratory:  Negative for cough, sputum production and shortness of breath.   Cardiovascular:  Negative for chest pain, palpitations, orthopnea and leg swelling.  Gastrointestinal:  Positive for nausea and vomiting. Negative for abdominal pain, constipation, diarrhea and heartburn.  Genitourinary:  Negative for dysuria and urgency.  Musculoskeletal:  Negative for back pain, myalgias and neck pain.  Neurological:  Negative for dizziness and headaches.  Psychiatric/Behavioral:  The patient is not nervous/anxious.     Past Medical History:  Diagnosis Date   Asthma    Urinary tract infection     History reviewed. No pertinent surgical history.   reports that she has never smoked. She has never used smokeless tobacco. She reports current alcohol use. She reports that she does not use drugs.  No Known Allergies  Family History  Problem Relation Age of Onset   Hypertension Maternal Grandfather    Hypertension Paternal Grandfather     Prior to Admission medications   Medication Sig Start Date End Date Taking? Authorizing Provider  hydrOXYzine (ATARAX) 25 MG tablet Take 1 tablet (25 mg total) by mouth at bedtime as needed (insomnia). 12/07/21   Starleen Blue, NP     Physical Exam: Vitals:   01/10/23 2100 01/10/23 2140 01/10/23 2240 01/11/23 0358  BP: (!) 95/57  97/72 (!) 96/54  Pulse: 77  78 (!) 51  Resp: 15  15 14   Temp:  98.3 F (36.8 C) 98.9 F (37.2 C) 98.4 F (36.9 C)  TempSrc:  Oral Oral Oral  SpO2: 100%  100% 100%  Weight:    71.6 kg  Height:    5' (1.524 m)     Physical Exam Constitutional:      General: She is not in acute distress.    Appearance: She is obese. She is ill-appearing.  HENT:     Nose: Nose normal.     Mouth/Throat:     Mouth: Mucous membranes are dry.  Eyes:     Pupils: Pupils are equal, round, and reactive to light.  Cardiovascular:     Rate and Rhythm: Normal rate and regular rhythm.     Pulses: Normal pulses.     Heart sounds: Normal heart sounds.  Pulmonary:     Effort: Pulmonary effort is normal.     Breath sounds: Normal breath sounds.  Abdominal:     General: Bowel sounds are normal. There is no distension.     Palpations: Abdomen is soft. There is no mass.     Tenderness: There is no abdominal tenderness. There is no guarding or rebound.     Hernia: No hernia is present.  Musculoskeletal:     Cervical back: Neck supple.     Right lower leg: No edema.     Left lower leg: No edema.  Skin:    Capillary Refill: Capillary refill takes less than 2 seconds.     Findings: No erythema, lesion or rash.  Neurological:     Mental Status: She is oriented to person, place, and time.  Psychiatric:        Mood and Affect: Mood normal.        Thought Content: Thought content normal.        Judgment: Judgment normal.      Labs on Admission: I have personally reviewed following labs and imaging studies  CBC: Recent Labs  Lab 01/10/23 1546 01/10/23 1756  WBC 11.5*  --   HGB 17.5* 18.4*  HCT 48.6* 54.0*  MCV 87.7  --   PLT 425*  --    Basic Metabolic Panel: Recent Labs  Lab 01/10/23 1546 01/10/23 1744 01/10/23 1756  NA 133*  --  134*  K 2.7*  --  2.6*  CL 88*  --   --   CO2 22  --   --   GLUCOSE 165*  --   --   BUN 24*  --   --   CREATININE 1.63*  --   --   CALCIUM 10.6*  --   --   MG  --  2.7*  --    GFR: Estimated Creatinine Clearance: 47.8 mL/min (A) (by C-G formula based on SCr of 1.63 mg/dL (H)). Liver Function Tests: Recent Labs  Lab 01/10/23 1546  AST 109*  ALT 311*  ALKPHOS 69   BILITOT 0.9  PROT 8.8*  ALBUMIN 4.9   Recent Labs  Lab 01/10/23 1546  LIPASE 261*   No results for input(s): "AMMONIA" in the last 168 hours. Coagulation Profile: Recent Labs  Lab 01/10/23 1744  INR 1.0   Cardiac Enzymes: No results for input(s): "CKTOTAL", "CKMB", "CKMBINDEX", "TROPONINI", "TROPONINIHS" in the last 168 hours. BNP (last 3 results) No results for input(s): "BNP" in the last 8760 hours. HbA1C: No results for input(s): "HGBA1C" in the last 72 hours.  CBG: No results for input(s): "GLUCAP" in the last 168 hours. Lipid Profile: No results for input(s): "CHOL", "HDL", "LDLCALC", "TRIG", "CHOLHDL", "LDLDIRECT" in the last 72 hours. Thyroid Function Tests: Recent Labs    01/10/23 1744  TSH 0.727   Anemia Panel: No results for input(s): "VITAMINB12", "FOLATE", "FERRITIN", "TIBC", "IRON", "RETICCTPCT" in the last 72 hours. Urine analysis:    Component Value Date/Time   COLORURINE YELLOW 01/10/2023 1744   APPEARANCEUR CLEAR 01/10/2023 1744   LABSPEC 1.026 01/10/2023 1744   PHURINE 6.5 01/10/2023 1744   GLUCOSEU NEGATIVE 01/10/2023 1744   HGBUR NEGATIVE 01/10/2023 1744   BILIRUBINUR NEGATIVE 01/10/2023 1744   KETONESUR 15 (A) 01/10/2023 1744   PROTEINUR 30 (A) 01/10/2023 1744   UROBILINOGEN 0.2 12/25/2013 2143   NITRITE NEGATIVE 01/10/2023 1744   LEUKOCYTESUR NEGATIVE 01/10/2023 1744    Radiological Exams on Admission: I have personally reviewed images CT ABDOMEN PELVIS W CONTRAST  Result Date: 01/10/2023 CLINICAL DATA:  Abdominal pain EXAM: CT ABDOMEN AND PELVIS WITH CONTRAST TECHNIQUE: Multidetector CT imaging of the abdomen and pelvis was performed using the standard protocol following bolus administration of intravenous contrast. RADIATION DOSE REDUCTION: This exam was performed according to the departmental dose-optimization program which includes automated exposure control, adjustment of the mA and/or kV according to patient size and/or use of  iterative reconstruction technique. CONTRAST:  OMNIPAQUE IOHEXOL 300 MG/ML  SOLN COMPARISON:  None Available. FINDINGS: Lower chest: No acute abnormality. Hepatobiliary: Gallstones. No focal hepatic abnormality or biliary dilatation Pancreas: Unremarkable. No pancreatic ductal dilatation or surrounding inflammatory changes. Spleen: Normal in size without focal abnormality. Adrenals/Urinary Tract: Adrenal glands are unremarkable. Kidneys are normal, without renal calculi, focal lesion, or hydronephrosis. Bladder is unremarkable. Stomach/Bowel: Stomach is within normal limits. Appendix appears normal. No evidence of bowel wall thickening, distention, or inflammatory changes. Collapsed appearance of the colon Vascular/Lymphatic: No significant vascular findings are present. No enlarged abdominal or pelvic lymph nodes. Reproductive: Uterus and bilateral adnexa are unremarkable. Other: No abdominal wall hernia or abnormality. No abdominopelvic ascites. Musculoskeletal: No acute or significant osseous findings. IMPRESSION: 1. No CT evidence for acute intra-abdominal or pelvic abnormality. 2. Gallstones. Electronically Signed   By: Jasmine Pang M.D.   On: 01/10/2023 21:27   US Abdomen Limited RUQ (LIVER/GB)  Result Date: 01/10/2023 CLINICAL DATA:  Nausea vomiting abdominal pain. EXAM: ULTRASOUND ABDOMEN LIMITED RIGHT UPPER QUADRANT COMPARISON:  None Available. FINDINGS: Gallbladder: Multiple gallstones. No gallbladder wall thickening or pericholecystic fluid. Negative sonographic Murphy's sign. Common bile duct: Diameter: 2 mm Liver: No focal lesion identified. Within normal limits in parenchymal echogenicity. Portal vein is patent on color Doppler imaging with normal direction of blood flow towards the liver. Other: None. IMPRESSION: Cholelithiasis without sonographic evidence of acute cholecystitis. Electronically Signed   By: Elgie Collard M.D.   On: 01/10/2023 20:10   DG Chest Port 1 View  Result  Date: 01/10/2023 CLINICAL DATA:  Possible sepsis EXAM: PORTABLE CHEST 1 VIEW COMPARISON:  12/15/2011 FINDINGS: The heart size and mediastinal contours are within normal limits. Both lungs are clear. The visualized skeletal structures are unremarkable. IMPRESSION: No active disease. Electronically Signed   By: Jasmine Pang M.D.   On: 01/10/2023 18:30    EKG: My personal interpretation of EKG shows: Showed sinus rhythm heart rate 59, short PR interval and prolonged QT interval.     Assessment/Plan: Principal Problem:   Intractable nausea and vomiting Active Problems:   Hypokalemia   Elevated lipase   Cholelithiasis  Hyponatremia   Hypochloremia   Metabolic acidosis    Assessment and Plan: Intractable nausea vomiting -Patient presented with complaining of intractable nausea vomiting since Saturday, 01/04/2023.  With associated poor oral tolerance. - At initial presentation tachycardic 107, respiratory 15, blood pressure 121/97 and O2 sat 9 7% room air. -Patient denies any use of recreational medications like marijuana or alcohol. -Physical exam benign on exam.  No abdominal pain. -CT abdomen pelvis showed cholelithiasis without any acute abdominal finding. - Right upper quadrant ultrasound cholelithiasis without any acute cholecystitis. -Blood alcohol level less than 10.  Normal salicylate and acetaminophen level. -Patient is afebrile however she is tachycardic and hypotensive.  Elevated lactic acid.  As patient does not have any fever low suspicion of any infection at this time. -Unclear etiology of nausea vomiting at this time. -Continue to treat symptomatically and continue supportive care. - In the ER patient received 4 L of LR bolus and currently on LR 125 cc/h to continue for next 24 hours. -Starting clear liquid diet and will advance diet as patient tolerates. - Continue Zofran as needed.  (EKG showed  QT prolongation continue to monitor as treating with  Zofran).  Transaminitis -CMP showed stable radicular derangement and elevated AST 109, elevated ALT 311.  Normal alkaline phosphatase normal bilirubin. - CT abdomen pelvis and right upper quadrant ultrasound showed cholelithiasis without any evidence of acute cholecystitis and no evidence of pancreatitis. -Transaminitis secondary to profound dehydration for last 6 to 7 days. -Checking hepatitis panel. -Continue monitor hepatic panel. -Consider consult gastroenterology in the morning for further evaluation.   Elevated lipase - Elevated lipase 261.  CT abdomen pelvis showed cholelithiasis.  No evidence of pancreatitis.  Abdominal exam benign. - Elevated lipase secondary to dehydration.  Low suspicion of acute pancreatitis at this time. - Checking lipase level in the a.m.  Cholelithiasis - CT abdomen pelvis and right upper quadrant ultrasound showed cholelithiasis without any evidence of acute cholecystitis. -Physical exam negative Murphy sign. -On discharge refer to general surgery for elective cholecystectomy.  Lactic acidosis -Initial lactic acid was 1.4 which has been trended up to 2.6 and patient became hypotensive in the ER.  Pressure dropped 121/97-96/54.  CBC showed mild leukocytosis 11.5.  Patient is afebrile.  Denies any chills.  Denies any fever, cough, abdominal pain and diarrhea.   -In the ED patient was resuscitated with total 4 L of LR bolus. -Plan to continue LR 125 cc/h. -Lactic acidosis secondary to dehydration and hypotension rather than from any infection from this time. - Checking lactic acid in the a.m.  Elevated anion gap -Elevated anion gap 23.  Normal bicarb level 22. -Concern for starvation ketosis from poor oral intake for last 7 days. -Controlling nausea so patient can tolerate oral diet. - Continue to monitor anion gap.  Electrolyte derangements -Low sodium 133, low potassium 2.7, low chloride 88.  Mag within normal range.   -Checking phosphorus  level. -Electrolyte derangement in the setting of vomiting and poor oral intake - Repleted IV potassium 10 x 6 mEq in the ED and currently giving another IV potassium 10 4 mEq.   -Continue cardiac monitoring.   AKI -Elevated creatinine 1.63.  Patient has baseline normal renal function. - Acute kidney injury in the setting of vomiting and dehydration. - Continue IV fluid. - Continue to monitor urine output. - Avoiding toxic agents.  Hypotension - In the ER patient developed hypotension blood pressure dropped to 96/54 and have lactic acidosis 2.6.  Patient is afebrile.  Have mild leukocytosis 11.5.  UA unremarkable.  Chest x-ray unremarkable.  CT abdomen pelvis showed cholelithiasis and right upper quadrant ultrasound showed cholelithiasis.  No evidence of pancreatitis or any other sign of infection at this time.  In the ED patient treated with ceftriaxone and metronidazole. -As patient does not have any fever and in the context of mild leukocytosis holding further antibiotic treatment at this time. - Blood cultures are in process which has been obtained in the ED. - Based on the blood culture result or if patient develops any fever in that case we will start antibiotic.   DVT prophylaxis:  Lovenox and SCD Code Status:  Full Code Diet: Clear liquid diet advance diet as patient tolerates. Family Communication: Patient's mother at the bedside updated. Disposition Plan: Pending improvement of nausea vomiting will intake.. Consults: None at this time Admission status:   Observation, Step Down Unit  Severity of Illness: The appropriate patient status for this patient is INPATIENT. Inpatient status is judged to be reasonable and necessary in order to provide the required intensity of service to ensure the patient's safety. The patient's presenting symptoms, physical exam findings, and initial radiographic and laboratory data in the context of their chronic comorbidities is felt to place them at  high risk for further clinical deterioration. Furthermore, it is not anticipated that the patient will be medically stable for discharge from the hospital within 2 midnights of admission.   * I certify that at the point of admission it is my clinical judgment that the patient will require inpatient hospital care spanning beyond 2 midnights from the point of admission due to high intensity of service, high risk for further deterioration and high frequency of surveillance required.Marland Kitchen    Tereasa Coop, MD [facility]  How to contact the [facility] Attending or Consulting provider 7A - 7P or covering provider during after hours 7P -7A, for this patient.  Check the care team in [facility] and look for a) attending/consulting [facility] provider listed and b) the [facility] team listed Log into [email or web address] and use [facility] universal password to access. If you do not have the password, please contact the hospital operator. Locate the [facility] provider you are looking for under [facility] and page to a number that you can be directly reached. If you still have difficulty reaching the provider, please page the [facility] (Director on Call) for the Hospitalists listed on amion for assistance.  01/11/2023, 5:37 AM

## 2023-01-11 NOTE — Progress Notes (Signed)
No charge progress note.  Carolyn Montgomery is a 22 y.o. female with medical history significant of adjustment disorder and asthma presented to emergency department for complaint of vomiting for 1 week.  Due to poor p.o. intolerance she was admitted to [facility]. Patient denies any illicit drug or marijuana. No abdominal pain, diarrhea, fever or chills.  On presenting to ED patient was mildly tachycardic, elevated lipase at 261, hypokalemia with potassium at 2.3, BUN 24, creatinine 1.63, calcium 10.6, AST 109, ALT 311 and elevated anion gap of 23.  Leukocytosis at 11.5, hemoglobin 17 and thrombocytosis at 425.  Respiratory panel negative.  UA with ketones, protein and few bacteria.  Normal ethanol, acetaminophen and salicylate levels.  Pregnancy test was negative.  TSH normal.  Lactic acid 2.6  CT abdomen pelvis no acute intra-abdominal or intrapelvic abnormality which showed cholelithiasis.  No evidence of pancreatitis.   Right upper quadrant ultrasound cholelithiasis without any evidence of acute cholecystitis.   X-ray no acute disease process.  Patient received 4 L of LR bolus and started on IV fluid with supportive care.  9/28: Leukocytosis resolved, likely hemoconcentration as repeat CBC after IV fluid mostly normal, Lactic acidosis resolved.  Repeat CMP with potassium of 2.6, mild hyponatremia with sodium of 134, albumin 2.9, improving AST and ALT.magnesium 2.7, potassium is being repleted.  Patient with no nausea or vomiting since in the hospital but does not want to try p.o.  Encouraging clear liquid diet and continuing IV fluid with supportive care at this time. No need for any antibiotics currently.  We will continue to observe.  Patient completely benign exam.  Also starting on some Protonix to decrease gastric irritation.  Discussed with mother at bedside

## 2023-01-11 NOTE — Plan of Care (Signed)

## 2023-01-11 NOTE — Progress Notes (Signed)
       CROSS COVER NOTE  NAME: Carolyn Montgomery MRN: [id number] DOB : 22-Sep-2000    Concern as stated by nurse / staff   Message received from bedside nurse Hello. My patient came in for intractable N/V. She got zofran about 5 hours ago and is still c/o nausea. Is there anything else we could get her for nausea?  Good evening. TY. I know they said she refused everything today because she was afraid to eat, but also said she doesn't see much of an improvement with the zofran. Also they gave her several runs of potassium this afternoon, I was wondering if you would like that rechecked before the AM? " "     Pertinent findings on chart review: Vitals:   01/10/23 2240 01/11/23 0358 01/11/23 1259 01/11/23 2019  BP: 97/72 (!) 96/54 (!) 97/53 111/66  Pulse: 78 (!) 51 (!) 53 (!) 58  Temp: 98.9 F (37.2 C) 98.4 F (36.9 C) 98.4 F (36.9 C) 98.8 F (37.1 C)  Resp: 15 14 16 18   Height:  5' (1.524 m)    Weight:  71.6 kg    SpO2: 100% 100% 100% 98%  TempSrc: Oral Oral Oral Oral  BMI (Calculated):  30.83         Latest Ref Rng & Units 01/11/2023    5:25 AM 01/10/2023    5:56 PM 01/10/2023    3:46 PM  BMP  Glucose 70 - 99 mg/dL 409   811   BUN 6 - 20 mg/dL 16   24   Creatinine 9.14 - 1.00 mg/dL 7.82   9.56   Sodium 213 - 145 mmol/L 134  134  133   Potassium 3.5 - 5.1 mmol/L 2.6  2.6  2.7   Chloride 98 - 111 mmol/L 97   88   CO2 22 - 32 mmol/L 26   22   Calcium 8.9 - 10.3 mg/dL 8.5   08.6    Mag normal Blood gas shows metabolic alkalosis TSH normal slight elevation T4 CT abdomen pelvis unrevealing for intractable N & V cause Preg test neg ECG monitorin PR 0.14 QRS 0.07 Qtc 0.41  Assessment and  Interventions   Assessment:  Plan: Reglan 10 mg x1 Recheck bmp and phos level at midnight Consider checking B12 and Vitamin D Also consider appetite stimulant such as megace      Donnie Mesa NP [facility] Cross Cover 7pm-7am - check amion for  availability Pager [phone]

## 2023-01-11 NOTE — Progress Notes (Signed)
Pt care assumed from previous RN at 1300 this shift. I have reviewed the previous RN's assessment and agree with their findings. Continue with plan of care.

## 2023-01-11 NOTE — Plan of Care (Signed)

## 2023-01-11 NOTE — Hospital Course (Addendum)
Taken from H&P.  Carolyn Montgomery is a 22 y.o. female with medical history significant of adjustment disorder and asthma presented to emergency department for complaint of vomiting for 1 week.  Due to poor p.o. intolerance she was admitted to [facility]. Patient denies any illicit drug or marijuana. No abdominal pain, diarrhea, fever or chills.  On presenting to ED patient was mildly tachycardic, elevated lipase at 261, hypokalemia with potassium at 2.3, BUN 24, creatinine 1.63, calcium 10.6, AST 109, ALT 311 and elevated anion gap of 23.  Leukocytosis at 11.5, hemoglobin 17 and thrombocytosis at 425.  Respiratory panel negative.  UA with ketones, protein and few bacteria.  Normal ethanol, acetaminophen and salicylate levels.  Pregnancy test was negative.  TSH normal.  Lactic acid 2.6  CT abdomen pelvis no acute intra-abdominal or intrapelvic abnormality which showed cholelithiasis.  No evidence of pancreatitis.   Right upper quadrant ultrasound cholelithiasis without any evidence of acute cholecystitis.   X-ray no acute disease process.  Patient received 4 L of LR bolus and started on IV fluid with supportive care.  9/28: Leukocytosis resolved, likely hemoconcentration as repeat CBC after IV fluid mostly normal, Lactic acidosis resolved.  Repeat CMP with potassium of 2.6, mild hyponatremia with sodium of 134, albumin 2.9, improving AST and ALT.magnesium 2.7, potassium is being repleted.  Patient with no nausea or vomiting since in the hospital but does not want to try p.o.  Encouraging clear liquid diet and continuing IV fluid with supportive care at this time. No need for any antibiotics currently.  We will continue to observe.

## 2023-01-12 DIAGNOSIS — R112 Nausea with vomiting, unspecified: Secondary | ICD-10-CM | POA: Diagnosis not present

## 2023-01-12 LAB — COMPREHENSIVE METABOLIC PANEL
ALT: 122 U/L — ABNORMAL HIGH (ref 0–44)
AST: 36 U/L (ref 15–41)
Albumin: 3.1 g/dL — ABNORMAL LOW (ref 3.5–5.0)
Alkaline Phosphatase: 43 U/L (ref 38–126)
Anion gap: 9 (ref 5–15)
BUN: 8 mg/dL (ref 6–20)
CO2: 21 mmol/L — ABNORMAL LOW (ref 22–32)
Calcium: 8.4 mg/dL — ABNORMAL LOW (ref 8.9–10.3)
Chloride: 101 mmol/L (ref 98–111)
Creatinine, Ser: 0.68 mg/dL (ref 0.44–1.00)
GFR, Estimated: >60 mL/min (ref >60)
Glucose, Bld: 107 mg/dL — ABNORMAL HIGH (ref 70–99)
Potassium: 3.4 mmol/L — ABNORMAL LOW (ref 3.5–5.1)
Sodium: 131 mmol/L — ABNORMAL LOW (ref 135–145)
Total Bilirubin: 0.8 mg/dL (ref 0.3–1.2)
Total Protein: 5.9 g/dL — ABNORMAL LOW (ref 6.5–8.1)

## 2023-01-12 LAB — MAGNESIUM: Magnesium: 2 mg/dL (ref 1.7–2.4)

## 2023-01-12 LAB — BASIC METABOLIC PANEL
Anion gap: 9 (ref 5–15)
BUN: 9 mg/dL (ref 6–20)
CO2: 24 mmol/L (ref 22–32)
Calcium: 8.9 mg/dL (ref 8.9–10.3)
Chloride: 104 mmol/L (ref 98–111)
Creatinine, Ser: 0.85 mg/dL (ref 0.44–1.00)
GFR, Estimated: >60 mL/min (ref >60)
Glucose, Bld: 92 mg/dL (ref 70–99)
Potassium: 3.9 mmol/L (ref 3.5–5.1)
Sodium: 137 mmol/L (ref 135–145)

## 2023-01-12 LAB — CBC
HCT: 36.4 % (ref 36.0–46.0)
Hemoglobin: 12.5 g/dL (ref 12.0–15.0)
MCH: 32.2 pg (ref 26.0–34.0)
MCHC: 34.3 g/dL (ref 30.0–36.0)
MCV: 93.8 fL (ref 80.0–100.0)
Platelets: 198 10*3/uL (ref 150–400)
RBC: 3.88 MIL/uL (ref 3.87–5.11)
RDW: 16.3 % — ABNORMAL HIGH (ref 11.5–15.5)
WBC: 6.2 10*3/uL (ref 4.0–10.5)
nRBC: 0 % (ref 0.0–0.2)

## 2023-01-12 LAB — LIPASE, BLOOD: Lipase: 370 U/L — ABNORMAL HIGH (ref 11–51)

## 2023-01-12 LAB — URINE CULTURE: Culture: NO GROWTH

## 2023-01-12 LAB — PHOSPHORUS: Phosphorus: 1.9 mg/dL — ABNORMAL LOW (ref 2.5–4.6)

## 2023-01-12 MED ORDER — POTASSIUM CHLORIDE 10 MEQ/100ML IV SOLN
10.0000 meq | INTRAVENOUS | Status: AC
  Administered 2023-01-12 (×2): 10 meq via INTRAVENOUS
  Filled 2023-01-12 (×2): qty 100

## 2023-01-12 MED ORDER — LACTATED RINGERS IV SOLN: INTRAVENOUS | Status: DC

## 2023-01-12 MED ORDER — POTASSIUM PHOSPHATES 15 MMOLE/5ML IV SOLN
30.0000 mmol | Freq: Once | INTRAVENOUS | Status: AC
  Administered 2023-01-12: 30 mmol via INTRAVENOUS
  Filled 2023-01-12: qty 10

## 2023-01-12 MED ORDER — PROCHLORPERAZINE EDISYLATE 10 MG/2ML IJ SOLN
5.0000 mg | Freq: Four times a day (QID) | INTRAMUSCULAR | Status: DC | PRN
  Administered 2023-01-12 – 2023-01-15 (×9): 5 mg via INTRAVENOUS
  Filled 2023-01-12 (×9): qty 2

## 2023-01-12 NOTE — Plan of Care (Signed)
  Problem: Education: Goal: Knowledge of General Education information will improve Description: Including pain rating scale, medication(s)/side effects and non-pharmacologic comfort measures Outcome: Progressing   Problem: Health Behavior/Discharge Planning: Goal: Ability to manage health-related needs will improve Outcome: Progressing   Problem: Clinical Measurements: Goal: Diagnostic test results will improve Outcome: Progressing Goal: Cardiovascular complication will be avoided Outcome: Progressing   Problem: Nutrition: Goal: Adequate nutrition will be maintained Outcome: Progressing   Problem: Coping: Goal: Level of anxiety will decrease Outcome: Progressing   Problem: Clinical Measurements: Goal: Ability to maintain clinical measurements within normal limits will improve Outcome: Adequate for Discharge Goal: Will remain free from infection Outcome: Adequate for Discharge Goal: Respiratory complications will improve Outcome: Adequate for Discharge   Problem: Activity: Goal: Risk for activity intolerance will decrease Outcome: Adequate for Discharge   Problem: Elimination: Goal: Will not experience complications related to bowel motility Outcome: Adequate for Discharge Goal: Will not experience complications related to urinary retention Outcome: Adequate for Discharge   Problem: Pain Managment: Goal: General experience of comfort will improve Outcome: Adequate for Discharge   Problem: Safety: Goal: Ability to remain free from injury will improve Outcome: Adequate for Discharge   Problem: Skin Integrity: Goal: Risk for impaired skin integrity will decrease Outcome: Adequate for Discharge

## 2023-01-12 NOTE — Progress Notes (Addendum)
PROGRESS NOTE  IVORI STORR IHK:[id number] DOB: 2000-08-19 DOA: 01/10/2023 PCP: Pcp, No  HPI/Recap of past 24 hours: Carolyn Montgomery is a 22 y.o. female with medical history significant of adjustment disorder and asthma presented to emergency department for complaint of vomiting for 1 week.  She tried Pepto-Bismol at home without any improvement.  Denies any use of illicit drug and smoking marijuana.  UDS positive for THC.  Lab studies were notable for elevated lipase which is uptrending 370 from 261.  Also, notable for elevated LFTs that are downtrending.  01/12/2023: The patient was seen and examined at bedside.  Her mother was present in the room.  Denies having any abdominal pain.  No tenderness on palpation of upper abdomen.  Her nausea is improved and she is asking for a solid diet.  Assessment/Plan: Principal Problem:   Intractable nausea and vomiting Active Problems:   Hypokalemia   Elevated lipase   Cholelithiasis   Hyponatremia   Hypochloremia   Metabolic acidosis   Metabolic alkalosis  Intractable nausea and vomiting, improving. Possibly related to [facility] use Continue IV fluid hydration Continue IV antiemetics Started on clear liquid diet, advance to soft diet  Elevated lipase level Cholelithiasis without evidence of cholecystitis Denies having any abdominal pain Nausea is improving Lipase is uptrending to 370 from 261 The patient has requested a solid diet, advanced to soft diet Repeat lipase level and LFTs in the morning  Hypokalemia Repleted intravenously Magnesium level 2.0. Repeat CMP in the morning.  Hypophosphatemia secondary to poor oral intake Serum phosphorus 1.9 Repleted intravenously Repeat phosphorus level in the morning.  Mild non anion gap metabolic acidosis Serum bicarb 21, anion gap of 9 Continue IV fluid hydration  Mild hyponatremia, likely secondary to poor oral intake Serum sodium 131 Continue IV fluid hydration Oral intake as  tolerated  THC use UDS positive for THC Recommend complete cessation  Prolonged QTc QTc greater than 500 Avoid QTc prolonging agents Optimize magnesium and potassium levels  Obesity BMI 31 Recommend weight loss outpatient regular physical activity and healthy dieting.  Time: 55 minutes.  Code Status: Full code  Family Communication: Mother at bedside  Disposition Plan: Likely will discharge tomorrow 01/13/2023   Consultants: None.  Procedures: None.  Antimicrobials: None.  DVT prophylaxis: Subcu Lovenox daily  Status is: Observation     Objective: Vitals:   01/11/23 2019 01/12/23 0500 01/12/23 0517 01/12/23 1332  BP: 111/66  117/71 119/79  Pulse: (!) 58  (!) 52 (!) 40  Resp: 18  16 14   Temp: 98.8 F (37.1 C)  99.3 F (37.4 C) 98 F (36.7 C)  TempSrc: Oral  Oral Oral  SpO2: 98%  100% 99%  Weight:  73.9 kg    Height:        Intake/Output Summary (Last 24 hours) at 01/12/2023 1815 Last data filed at 01/12/2023 0600 Gross per 24 hour  Intake 1868.47 ml  Output --  Net 1868.47 ml   Filed Weights   01/10/23 1541 01/11/23 0358 01/12/23 0500  Weight: 86.2 kg 71.6 kg 73.9 kg    Exam:  General: 22 y.o. year-old female well developed well nourished in no acute distress.  Alert and oriented x3. Cardiovascular: Regular rate and rhythm with no rubs or gallops.  No thyromegaly or JVD noted.   Respiratory: Clear to auscultation with no wheezes or rales. Good inspiratory effort. Abdomen: Soft nontender nondistended with normal bowel sounds x4 quadrants. Musculoskeletal: No lower extremity edema. 2/4 pulses in all  4 extremities. Skin: No ulcerative lesions noted or rashes, Psychiatry: Mood is appropriate for condition and setting   Data Reviewed: CBC: Recent Labs  Lab 01/10/23 1546 01/10/23 1756 01/11/23 0525 01/12/23 0818  WBC 11.5*  --  8.1 6.2  HGB 17.5* 18.4* 11.8* 12.5  HCT 48.6* 54.0* 34.8* 36.4  MCV 87.7  --  92.3 93.8  PLT 425*  --  213  198   Basic Metabolic Panel: Recent Labs  Lab 01/10/23 1546 01/10/23 1744 01/10/23 1756 01/11/23 0525 01/12/23 0011 01/12/23 0818  NA 133*  --  134* 134* 137 131*  K 2.7*  --  2.6* 2.6* 3.9 3.4*  CL 88*  --   --  97* 104 101  CO2 22  --   --  26 24 21*  GLUCOSE 165*  --   --  106* 92 107*  BUN 24*  --   --  16 9 8   CREATININE 1.63*  --   --  1.14* 0.85 0.68  CALCIUM 10.6*  --   --  8.5* 8.9 8.4*  MG  --  2.7*  --   --   --  2.0  PHOS  --   --   --  2.5 1.9*  --    GFR: Estimated Creatinine Clearance: 99.1 mL/min (by C-G formula based on SCr of 0.68 mg/dL). Liver Function Tests: Recent Labs  Lab 01/10/23 1546 01/11/23 0525 01/12/23 0818  AST 109* 58* 36  ALT 311* 171* 122*  ALKPHOS 69 45 43  BILITOT 0.9 0.8 0.8  PROT 8.8* 5.5* 5.9*  ALBUMIN 4.9 2.9* 3.1*   Recent Labs  Lab 01/10/23 1546 01/12/23 0818  LIPASE 261* 370*   No results for input(s): "AMMONIA" in the last 168 hours. Coagulation Profile: Recent Labs  Lab 01/10/23 1744  INR 1.0   Cardiac Enzymes: No results for input(s): "CKTOTAL", "CKMB", "CKMBINDEX", "TROPONINI" in the last 168 hours. BNP (last 3 results) No results for input(s): "PROBNP" in the last 8760 hours. HbA1C: No results for input(s): "HGBA1C" in the last 72 hours. CBG: No results for input(s): "GLUCAP" in the last 168 hours. Lipid Profile: No results for input(s): "CHOL", "HDL", "LDLCALC", "TRIG", "CHOLHDL", "LDLDIRECT" in the last 72 hours. Thyroid Function Tests: Recent Labs    01/10/23 1744 01/10/23 2122  TSH 0.727  --   FREET4  --  1.28*   Anemia Panel: No results for input(s): "VITAMINB12", "FOLATE", "FERRITIN", "TIBC", "IRON", "RETICCTPCT" in the last 72 hours. Urine analysis:    Component Value Date/Time   COLORURINE YELLOW 01/10/2023 1744   APPEARANCEUR CLEAR 01/10/2023 1744   LABSPEC 1.026 01/10/2023 1744   PHURINE 6.5 01/10/2023 1744   GLUCOSEU NEGATIVE 01/10/2023 1744   HGBUR NEGATIVE 01/10/2023 1744    BILIRUBINUR NEGATIVE 01/10/2023 1744   KETONESUR 15 (A) 01/10/2023 1744   PROTEINUR 30 (A) 01/10/2023 1744   UROBILINOGEN 0.2 12/25/2013 2143   NITRITE NEGATIVE 01/10/2023 1744   LEUKOCYTESUR NEGATIVE 01/10/2023 1744   Sepsis Labs: @LABRCNTIP (procalcitonin:4,lacticidven:4)  ) Recent Results (from the past 240 hour(s))  SARS Coronavirus 2 by RT PCR (hospital order, performed in [facility] hospital lab) *cepheid single result test* Anterior Nasal Swab     Status: None   Collection Time: 01/10/23  3:46 PM   Specimen: Anterior Nasal Swab  Result Value Ref Range Status   SARS Coronavirus 2 by RT PCR NEGATIVE NEGATIVE Final    Comment: (NOTE) SARS-CoV-2 target nucleic acids are NOT DETECTED.  The SARS-CoV-2 RNA is generally  detectable in upper and lower respiratory specimens during the acute phase of infection. The lowest concentration of SARS-CoV-2 viral copies this assay can detect is 250 copies / mL. A negative result does not preclude SARS-CoV-2 infection and should not be used as the sole basis for treatment or other patient management decisions.  A negative result may occur with improper specimen collection / handling, submission of specimen other than nasopharyngeal swab, presence of viral mutation(s) within the areas targeted by this assay, and inadequate number of viral copies (<250 copies / mL). A negative result must be combined with clinical observations, patient history, and epidemiological information.  Fact Sheet for Patients:   RoadLapTop.co.za  Fact Sheet for Healthcare Providers: http://kim-miller.com/  This test is not yet approved or  cleared by the [place] FDA and has been authorized for detection and/or diagnosis of SARS-CoV-2 by FDA under an Emergency Use Authorization (EUA).  This EUA will remain in effect (meaning this test can be used) for the duration of the COVID-19 declaration under Section 564(b)(1)  of the Act, 21 U.S.C. section 360bbb-3(b)(1), unless the authorization is terminated or revoked sooner.  Performed at [facility], [address], [place], [place] [address]   Resp panel by RT-PCR (RSV, Flu A&B, Covid) Anterior Nasal Swab     Status: None   Collection Time: 01/10/23  3:46 PM   Specimen: Anterior Nasal Swab  Result Value Ref Range Status   SARS Coronavirus 2 by RT PCR NEGATIVE NEGATIVE Final    Comment: (NOTE) SARS-CoV-2 target nucleic acids are NOT DETECTED.  The SARS-CoV-2 RNA is generally detectable in upper respiratory specimens during the acute phase of infection. The lowest concentration of SARS-CoV-2 viral copies this assay can detect is 138 copies/mL. A negative result does not preclude SARS-Cov-2 infection and should not be used as the sole basis for treatment or other patient management decisions. A negative result may occur with  improper specimen collection/handling, submission of specimen other than nasopharyngeal swab, presence of viral mutation(s) within the areas targeted by this assay, and inadequate number of viral copies(<138 copies/mL). A negative result must be combined with clinical observations, patient history, and epidemiological information. The expected result is Negative.  Fact Sheet for Patients:  BloggerCourse.com  Fact Sheet for Healthcare Providers:  SeriousBroker.it  This test is no t yet approved or cleared by the [place] FDA and  has been authorized for detection and/or diagnosis of SARS-CoV-2 by FDA under an Emergency Use Authorization (EUA). This EUA will remain  in effect (meaning this test can be used) for the duration of the COVID-19 declaration under Section 564(b)(1) of the Act, 21 U.S.C.section 360bbb-3(b)(1), unless the authorization is terminated  or revoked sooner.       Influenza A by PCR NEGATIVE NEGATIVE Final   Influenza B by PCR  NEGATIVE NEGATIVE Final    Comment: (NOTE) The Xpert Xpress SARS-CoV-2/FLU/RSV plus assay is intended as an aid in the diagnosis of influenza from Nasopharyngeal swab specimens and should not be used as a sole basis for treatment. Nasal washings and aspirates are unacceptable for Xpert Xpress SARS-CoV-2/FLU/RSV testing.  Fact Sheet for Patients: BloggerCourse.com  Fact Sheet for Healthcare Providers: SeriousBroker.it  This test is not yet approved or cleared by the [place] FDA and has been authorized for detection and/or diagnosis of SARS-CoV-2 by FDA under an Emergency Use Authorization (EUA). This EUA will remain in effect (meaning this test can be used) for the duration of the COVID-19 declaration under  Section 564(b)(1) of the Act, 21 U.S.C. section 360bbb-3(b)(1), unless the authorization is terminated or revoked.     Resp Syncytial Virus by PCR NEGATIVE NEGATIVE Final    Comment: (NOTE) Fact Sheet for Patients: BloggerCourse.com  Fact Sheet for Healthcare Providers: SeriousBroker.it  This test is not yet approved or cleared by the [place] FDA and has been authorized for detection and/or diagnosis of SARS-CoV-2 by FDA under an Emergency Use Authorization (EUA). This EUA will remain in effect (meaning this test can be used) for the duration of the COVID-19 declaration under Section 564(b)(1) of the Act, 21 U.S.C. section 360bbb-3(b)(1), unless the authorization is terminated or revoked.  Performed at [facility], [address] [place], [place] [address]   Blood Culture (routine x 2)     Status: None (Preliminary result)   Collection Time: 01/10/23  5:02 PM   Specimen: BLOOD  Result Value Ref Range Status   Specimen Description   Final    BLOOD LEFT ANTECUBITAL Performed at [facility], [address]  [place], [place] [address]    Special Requests   Final    BOTTLES DRAWN AEROBIC AND ANAEROBIC Blood Culture adequate volume Performed at [facility], [address], [place], [place] [address]    Culture   Final    NO GROWTH 2 DAYS Performed at [facility], [address], [place], [place] [address]    Report Status PENDING  Incomplete  Blood Culture (routine x 2)     Status: None (Preliminary result)   Collection Time: 01/10/23  5:07 PM   Specimen: BLOOD  Result Value Ref Range Status   Specimen Description   Final    BLOOD BLOOD LEFT FOREARM Performed at [facility], [address] [place], [place] [address]    Special Requests   Final    BOTTLES DRAWN AEROBIC AND ANAEROBIC Blood Culture adequate volume Performed at [facility], [address], [place], [place] [address]    Culture   Final    NO GROWTH 2 DAYS Performed at [facility], [address], [place], [place] [address]    Report Status PENDING  Incomplete  Urine Culture     Status: None   Collection Time: 01/10/23  5:44 PM   Specimen: Urine, Random  Result Value Ref Range Status   Specimen Description   Final    URINE, RANDOM Performed at [facility], [address], [place], [place] [address]    Special Requests   Final    NONE Reflexed from [id number] Performed at [facility], [address] [place], [place] [address]    Culture   Final    NO GROWTH Performed at [facility], [address], [place], [place] [address]    Report Status 01/12/2023 FINAL  Final      Studies: No results found.  Scheduled Meds:  enoxaparin (LOVENOX) injection  40 mg Subcutaneous Q24H   pantoprazole  40 mg Oral Daily   sodium chloride flush  3 mL Intravenous Q12H    Continuous Infusions:  sodium chloride Stopped (01/12/23 0518)     LOS: 0 days     Darlin Drop, MD [facility] Pager  [phone]  If 7PM-7AM, please contact night-coverage [email or web address] Password [facility] 01/12/2023, 6:15 PM

## 2023-01-13 DIAGNOSIS — R112 Nausea with vomiting, unspecified: Secondary | ICD-10-CM | POA: Diagnosis not present

## 2023-01-13 LAB — COMPREHENSIVE METABOLIC PANEL
ALT: 103 U/L — ABNORMAL HIGH (ref 0–44)
AST: 32 U/L (ref 15–41)
Albumin: 2.9 g/dL — ABNORMAL LOW (ref 3.5–5.0)
Alkaline Phosphatase: 43 U/L (ref 38–126)
Anion gap: 8 (ref 5–15)
BUN: 5 mg/dL — ABNORMAL LOW (ref 6–20)
CO2: 22 mmol/L (ref 22–32)
Calcium: 8.7 mg/dL — ABNORMAL LOW (ref 8.9–10.3)
Chloride: 107 mmol/L (ref 98–111)
Creatinine, Ser: 0.7 mg/dL (ref 0.44–1.00)
GFR, Estimated: >60 mL/min (ref >60)
Glucose, Bld: 96 mg/dL (ref 70–99)
Potassium: 3.5 mmol/L (ref 3.5–5.1)
Sodium: 137 mmol/L (ref 135–145)
Total Bilirubin: 0.8 mg/dL (ref 0.3–1.2)
Total Protein: 5.5 g/dL — ABNORMAL LOW (ref 6.5–8.1)

## 2023-01-13 LAB — CBC
HCT: 36.8 % (ref 36.0–46.0)
Hemoglobin: 12.2 g/dL (ref 12.0–15.0)
MCH: 31.3 pg (ref 26.0–34.0)
MCHC: 33.2 g/dL (ref 30.0–36.0)
MCV: 94.4 fL (ref 80.0–100.0)
Platelets: 170 10*3/uL (ref 150–400)
RBC: 3.9 MIL/uL (ref 3.87–5.11)
RDW: 16.2 % — ABNORMAL HIGH (ref 11.5–15.5)
WBC: 6.4 10*3/uL (ref 4.0–10.5)
nRBC: 0 % (ref 0.0–0.2)

## 2023-01-13 LAB — LIPASE, BLOOD: Lipase: 436 U/L — ABNORMAL HIGH (ref 11–51)

## 2023-01-13 LAB — PHOSPHORUS: Phosphorus: 2.7 mg/dL (ref 2.5–4.6)

## 2023-01-13 MED ORDER — LACTATED RINGERS IV SOLN: INTRAVENOUS | Status: AC

## 2023-01-13 NOTE — Progress Notes (Signed)
PROGRESS NOTE  Carolyn Montgomery:[id number] DOB: 05-Oct-2000 DOA: 01/10/2023 PCP: Pcp, No  HPI/Recap of past 24 Montgomery: Carolyn Montgomery is a 22 y.o. female with medical history significant of adjustment disorder and asthma presented to emergency department for complaint of vomiting for 1 week.  She tried Pepto-Bismol at home without any improvement.  Denies any use of illicit drug and smoking marijuana.  UDS positive for THC.  Lab studies were notable for elevated lipase which is uptrending 370 from 261.  Also, notable for elevated LFTs that are downtrending.  01/13/2023: Seen and examined at bedside.  Endorses persistent nausea, worse after eating.  Denies any abdominal pain.  Lipase level uptrending to 400s.  GI consulted to assist with the management.  Admits to smoking marijuana on weekends.  Assessment/Plan: Principal Problem:   Intractable nausea and vomiting Active Problems:   Hypokalemia   Elevated lipase   Cholelithiasis   Hyponatremia   Hypochloremia   Metabolic acidosis   Metabolic alkalosis  Intractable nausea and vomiting, persistent. Possibly related to [facility] use versus cholelithiasis with no evidence of choledocholithiasis. Continue IV fluid hydration Continue IV antiemetics Was advanced to soft diet at the patient's request.  Elevated lipase level Cholelithiasis without evidence of cholecystitis or choledocholithiasis Denies having any abdominal pain Nausea is persistent Lipase is uptrending to 436 from 370 from 261 The patient has requested a solid diet despite nausea. Seen by GI, appreciate assistance. Will get general surgery's to weigh in.  Hypokalemia, resolved post repletion. Repleted intravenously Magnesium level 2.0.  Hypophosphatemia secondary to poor oral intake, resolved post repletion. Serum phosphorus 1.9>> 2.7.  Resolved mild non anion gap metabolic acidosis  Resolved mild hyponatremia, likely secondary to poor oral intake  THC use UDS  positive for THC Recommend complete cessation  Prolonged QTc QTc greater than 500 Avoid QTc prolonging agents Optimize magnesium and potassium levels  Obesity BMI 31 Recommend weight loss outpatient regular physical activity and healthy dieting.  Time: 55 minutes.  Code Status: Full code  Family Communication: None at bedside.  Disposition Plan: Likely will discharge tomorrow 01/14/2023.   Consultants: GI General surgery.  Procedures: None.  Antimicrobials: None.  DVT prophylaxis: Subcu Lovenox daily  Status is: Observation     Objective: Vitals:   01/12/23 2016 01/13/23 0500 01/13/23 0624 01/13/23 1235  BP: 125/77  124/69 116/75  Pulse:    (!) 46  Resp: 16  15   Temp: 97.6 F (36.4 C)  98.3 F (36.8 C) 98.6 F (37 C)  TempSrc: Oral  Oral Oral  SpO2: 100%  99% 100%  Weight:  73.8 kg    Height:        Intake/Output Summary (Last 24 Montgomery) at 01/13/2023 1720 Last data filed at 01/13/2023 0500 Gross per 24 hour  Intake 1533.21 ml  Output --  Net 1533.21 ml   Filed Weights   01/11/23 0358 01/12/23 0500 01/13/23 0500  Weight: 71.6 kg 73.9 kg 73.8 kg    Exam:  General: 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  She is alert and oriented x 3.   Cardiovascular: Regular rate and rhythm with no rubs or gallops.  No thyromegaly or JVD noted.   Respiratory: Clear to auscultation with no wheezes or rales. Good inspiratory effort. Abdomen: Soft nontender nondistended with bowel sounds present. Musculoskeletal: No lower extremity edema. 2/4 pulses in all 4 extremities. Skin: No ulcerative lesions noted or rashes, Psychiatry: Mood is appropriate for condition and setting  Data Reviewed: CBC: Recent Labs  Lab 01/10/23 1546 01/10/23 1756 01/11/23 0525 01/12/23 0818 01/13/23 0348  WBC 11.5*  --  8.1 6.2 6.4  HGB 17.5* 18.4* 11.8* 12.5 12.2  HCT 48.6* 54.0* 34.8* 36.4 36.8  MCV 87.7  --  92.3 93.8 94.4  PLT 425*  --  213  198 170   Basic Metabolic Panel: Recent Labs  Lab 01/10/23 1546 01/10/23 1744 01/10/23 1756 01/11/23 0525 01/12/23 0011 01/12/23 0818 01/13/23 0348  NA 133*  --  134* 134* 137 131* 137  K 2.7*  --  2.6* 2.6* 3.9 3.4* 3.5  CL 88*  --   --  97* 104 101 107  CO2 22  --   --  26 24 21* 22  GLUCOSE 165*  --   --  106* 92 107* 96  BUN 24*  --   --  16 9 8  5*  CREATININE 1.63*  --   --  1.14* 0.85 0.68 0.70  CALCIUM 10.6*  --   --  8.5* 8.9 8.4* 8.7*  MG  --  2.7*  --   --   --  2.0  --   PHOS  --   --   --  2.5 1.9*  --  2.7   GFR: Estimated Creatinine Clearance: 98.9 mL/min (by C-G formula based on SCr of 0.7 mg/dL). Liver Function Tests: Recent Labs  Lab 01/10/23 1546 01/11/23 0525 01/12/23 0818 01/13/23 0348  AST 109* 58* 36 32  ALT 311* 171* 122* 103*  ALKPHOS 69 45 43 43  BILITOT 0.9 0.8 0.8 0.8  PROT 8.8* 5.5* 5.9* 5.5*  ALBUMIN 4.9 2.9* 3.1* 2.9*   Recent Labs  Lab 01/10/23 1546 01/12/23 0818 01/13/23 0348  LIPASE 261* 370* 436*   No results for input(s): "AMMONIA" in the last 168 Montgomery. Coagulation Profile: Recent Labs  Lab 01/10/23 1744  INR 1.0   Cardiac Enzymes: No results for input(s): "CKTOTAL", "CKMB", "CKMBINDEX", "TROPONINI" in the last 168 Montgomery. BNP (last 3 results) No results for input(s): "PROBNP" in the last 8760 Montgomery. HbA1C: No results for input(s): "HGBA1C" in the last 72 Montgomery. CBG: No results for input(s): "GLUCAP" in the last 168 Montgomery. Lipid Profile: No results for input(s): "CHOL", "HDL", "LDLCALC", "TRIG", "CHOLHDL", "LDLDIRECT" in the last 72 Montgomery. Thyroid Function Tests: Recent Labs    01/10/23 1744 01/10/23 2122  TSH 0.727  --   FREET4  --  1.28*   Anemia Panel: No results for input(s): "VITAMINB12", "FOLATE", "FERRITIN", "TIBC", "IRON", "RETICCTPCT" in the last 72 Montgomery. Urine analysis:    Component Value Date/Time   COLORURINE YELLOW 01/10/2023 1744   APPEARANCEUR CLEAR 01/10/2023 1744   LABSPEC 1.026  01/10/2023 1744   PHURINE 6.5 01/10/2023 1744   GLUCOSEU NEGATIVE 01/10/2023 1744   HGBUR NEGATIVE 01/10/2023 1744   BILIRUBINUR NEGATIVE 01/10/2023 1744   KETONESUR 15 (A) 01/10/2023 1744   PROTEINUR 30 (A) 01/10/2023 1744   UROBILINOGEN 0.2 12/25/2013 2143   NITRITE NEGATIVE 01/10/2023 1744   LEUKOCYTESUR NEGATIVE 01/10/2023 1744   Sepsis Labs: @LABRCNTIP (procalcitonin:4,lacticidven:4)  ) Recent Results (from the past 240 hour(s))  SARS Coronavirus 2 by RT PCR (hospital order, performed in [facility] hospital lab) *cepheid single result test* Anterior Nasal Swab     Status: None   Collection Time: 01/10/23  3:46 PM   Specimen: Anterior Nasal Swab  Result Value Ref Range Status   SARS Coronavirus 2 by RT PCR NEGATIVE NEGATIVE Final    Comment: (NOTE)  SARS-CoV-2 target nucleic acids are NOT DETECTED.  The SARS-CoV-2 RNA is generally detectable in upper and lower respiratory specimens during the acute phase of infection. The lowest concentration of SARS-CoV-2 viral copies this assay can detect is 250 copies / mL. A negative result does not preclude SARS-CoV-2 infection and should not be used as the sole basis for treatment or other patient management decisions.  A negative result may occur with improper specimen collection / handling, submission of specimen other than nasopharyngeal swab, presence of viral mutation(s) within the areas targeted by this assay, and inadequate number of viral copies (<250 copies / mL). A negative result must be combined with clinical observations, patient history, and epidemiological information.  Fact Sheet for Patients:   RoadLapTop.co.za  Fact Sheet for Healthcare Providers: http://kim-miller.com/  This test is not yet approved or  cleared by the [place] FDA and has been authorized for detection and/or diagnosis of SARS-CoV-2 by FDA under an Emergency Use Authorization (EUA).  This EUA will  remain in effect (meaning this test can be used) for the duration of the COVID-19 declaration under Section 564(b)(1) of the Act, 21 U.S.C. section 360bbb-3(b)(1), unless the authorization is terminated or revoked sooner.  Performed at [facility], [address], [place], [place] [address]   Resp panel by RT-PCR (RSV, Flu A&B, Covid) Anterior Nasal Swab     Status: None   Collection Time: 01/10/23  3:46 PM   Specimen: Anterior Nasal Swab  Result Value Ref Range Status   SARS Coronavirus 2 by RT PCR NEGATIVE NEGATIVE Final    Comment: (NOTE) SARS-CoV-2 target nucleic acids are NOT DETECTED.  The SARS-CoV-2 RNA is generally detectable in upper respiratory specimens during the acute phase of infection. The lowest concentration of SARS-CoV-2 viral copies this assay can detect is 138 copies/mL. A negative result does not preclude SARS-Cov-2 infection and should not be used as the sole basis for treatment or other patient management decisions. A negative result may occur with  improper specimen collection/handling, submission of specimen other than nasopharyngeal swab, presence of viral mutation(s) within the areas targeted by this assay, and inadequate number of viral copies(<138 copies/mL). A negative result must be combined with clinical observations, patient history, and epidemiological information. The expected result is Negative.  Fact Sheet for Patients:  BloggerCourse.com  Fact Sheet for Healthcare Providers:  SeriousBroker.it  This test is no t yet approved or cleared by the [place] FDA and  has been authorized for detection and/or diagnosis of SARS-CoV-2 by FDA under an Emergency Use Authorization (EUA). This EUA will remain  in effect (meaning this test can be used) for the duration of the COVID-19 declaration under Section 564(b)(1) of the Act, 21 U.S.C.section 360bbb-3(b)(1), unless the  authorization is terminated  or revoked sooner.       Influenza A by PCR NEGATIVE NEGATIVE Final   Influenza B by PCR NEGATIVE NEGATIVE Final    Comment: (NOTE) The Xpert Xpress SARS-CoV-2/FLU/RSV plus assay is intended as an aid in the diagnosis of influenza from Nasopharyngeal swab specimens and should not be used as a sole basis for treatment. Nasal washings and aspirates are unacceptable for Xpert Xpress SARS-CoV-2/FLU/RSV testing.  Fact Sheet for Patients: BloggerCourse.com  Fact Sheet for Healthcare Providers: SeriousBroker.it  This test is not yet approved or cleared by the [place] FDA and has been authorized for detection and/or diagnosis of SARS-CoV-2 by FDA under an Emergency Use Authorization (EUA). This EUA will remain in effect (meaning  this test can be used) for the duration of the COVID-19 declaration under Section 564(b)(1) of the Act, 21 U.S.C. section 360bbb-3(b)(1), unless the authorization is terminated or revoked.     Resp Syncytial Virus by PCR NEGATIVE NEGATIVE Final    Comment: (NOTE) Fact Sheet for Patients: BloggerCourse.com  Fact Sheet for Healthcare Providers: SeriousBroker.it  This test is not yet approved or cleared by the [place] FDA and has been authorized for detection and/or diagnosis of SARS-CoV-2 by FDA under an Emergency Use Authorization (EUA). This EUA will remain in effect (meaning this test can be used) for the duration of the COVID-19 declaration under Section 564(b)(1) of the Act, 21 U.S.C. section 360bbb-3(b)(1), unless the authorization is terminated or revoked.  Performed at [facility], [address] [place], [place] [address]   Blood Culture (routine x 2)     Status: None (Preliminary result)   Collection Time: 01/10/23  5:02 PM   Specimen: BLOOD  Result Value Ref Range Status   Specimen  Description   Final    BLOOD LEFT ANTECUBITAL Performed at [facility], [address], [place], [place] [address]    Special Requests   Final    BOTTLES DRAWN AEROBIC AND ANAEROBIC Blood Culture adequate volume Performed at [facility], [address] [place], [place] [address]    Culture   Final    NO GROWTH 3 DAYS Performed at [facility], [address] [place], [place] [address]    Report Status PENDING  Incomplete  Blood Culture (routine x 2)     Status: None (Preliminary result)   Collection Time: 01/10/23  5:07 PM   Specimen: BLOOD  Result Value Ref Range Status   Specimen Description   Final    BLOOD BLOOD LEFT FOREARM Performed at [facility], [address] [place], [place] [address]    Special Requests   Final    BOTTLES DRAWN AEROBIC AND ANAEROBIC Blood Culture adequate volume Performed at [facility], [address] [place], [place] [address]    Culture   Final    NO GROWTH 3 DAYS Performed at [facility], [address], [place], [place] [address]    Report Status PENDING  Incomplete  Urine Culture     Status: None   Collection Time: 01/10/23  5:44 PM   Specimen: Urine, Random  Result Value Ref Range Status   Specimen Description   Final    URINE, RANDOM Performed at [facility], [address] [place], [place] [address]    Special Requests   Final    NONE Reflexed from [id number] Performed at [facility], [address] [place], [place] [address]    Culture   Final    NO GROWTH Performed at [facility], [address], [place], [place] [address]    Report Status 01/12/2023 FINAL  Final      Studies: No results found.  Scheduled Meds:  enoxaparin (LOVENOX) injection  40 mg Subcutaneous Q24H   sodium chloride flush  3 mL Intravenous Q12H    Continuous Infusions:  sodium chloride Stopped (01/12/23  0518)   lactated ringers 125 mL/hr at 01/13/23 1219     LOS: 0 days     Darlin Drop, MD [facility] Pager [phone]  If 7PM-7AM, please contact night-coverage [email or web address] Password [email or web address] 01/13/2023, 5:20 PM

## 2023-01-13 NOTE — Consult Note (Signed)
UNASSIGNED PATIENT Reason for Consult: Elevated lipase levels. Referring Physician: [facility].  Carolyn Montgomery is an 22 y.o. female.  HPI: Carolyn Montgomery is a 22 year old black female with a history of asthma and adjustment disorder with been smoking marijuana on a regular basis and developed severe nausea and vomiting a week ago.  She claims she is unable to keep anything down but has had had some peaches today which she did not throw up.  On admission she was noted to have an AST of 58 with an ALT of 171 and total protein of 5.5 and alkaline phosphatase of 435 with a normal bilirubin level of 0.8 LFTs are improved today with an AST of 32 and ALT of 103 with total bili of 0.8 but her lipase has increased from 370 yesterday to 436 today but she denies having any abdominal pain or nausea at this time.  Abdominal ultrasound done on 01/10/2023 revealed cholelithiasis with no evidence of acute cholecystitis CT scan done shortly thereafter showed no evidence of acute intra-abdominal pelvic abnormality except for gallstones.  The pancreas appeared unremarkable on the CT scan; no biliary ductal dilatation is noted and there is no evidence of choledocholithiasis..  Past Medical History:  Diagnosis Date   Asthma    Urinary tract infection    History reviewed. No pertinent surgical history.  Family History  Problem Relation Age of Onset   Hypertension Maternal Grandfather    Hypertension Paternal Grandfather    Social History:  reports that she has never smoked. She has never used smokeless tobacco. She reports current alcohol use. She reports that she does not use drugs.  Allergies: No Known Allergies  Medications: I have reviewed the patient's current medications. Prior to Admission:  No medications prior to admission.   Scheduled:  enoxaparin (LOVENOX) injection  40 mg Subcutaneous Q24H   sodium chloride flush  3 mL Intravenous Q12H   Continuous:  sodium chloride Stopped (01/12/23 0518)    lactated ringers 125 mL/hr at 01/13/23 1219   JWJ:[id number] chloride, acetaminophen **OR** acetaminophen, mouth rinse, prochlorperazine, sodium chloride flush  Results for orders placed or performed during the hospital encounter of 01/10/23 (from the past 48 hour(s))  Basic metabolic panel     Status: None   Collection Time: 01/12/23 12:11 AM  Result Value Ref Range   Sodium 137 135 - 145 mmol/L   Potassium 3.9 3.5 - 5.1 mmol/L   Chloride 104 98 - 111 mmol/L   CO2 24 22 - 32 mmol/L   Glucose, Bld 92 70 - 99 mg/dL    Comment: Glucose reference range applies only to samples taken after fasting for at least 8 hours.   BUN 9 6 - 20 mg/dL   Creatinine, Ser 2.95 0.44 - 1.00 mg/dL   Calcium 8.9 8.9 - 62.1 mg/dL   GFR, Estimated >30 >86 mL/min    Comment: (NOTE) Calculated using the CKD-EPI Creatinine Equation (2021)    Anion gap 9 5 - 15    Comment: Performed at [facility], [address] [place], [place] [address]  Phosphorus     Status: Abnormal   Collection Time: 01/12/23 12:11 AM  Result Value Ref Range   Phosphorus 1.9 (L) 2.5 - 4.6 mg/dL    Comment: Performed at [facility], [address] [place], [place] [address]  Comprehensive metabolic panel     Status: Abnormal   Collection Time: 01/12/23  8:18 AM  Result Value Ref Range   Sodium 131 (  L) 135 - 145 mmol/L   Potassium 3.4 (L) 3.5 - 5.1 mmol/L   Chloride 101 98 - 111 mmol/L   CO2 21 (L) 22 - 32 mmol/L   Glucose, Bld 107 (H) 70 - 99 mg/dL    Comment: Glucose reference range applies only to samples taken after fasting for at least 8 hours.   BUN 8 6 - 20 mg/dL   Creatinine, Ser 4.09 0.44 - 1.00 mg/dL   Calcium 8.4 (L) 8.9 - 10.3 mg/dL   Total Protein 5.9 (L) 6.5 - 8.1 g/dL   Albumin 3.1 (L) 3.5 - 5.0 g/dL   AST 36 15 - 41 U/L   ALT 122 (H) 0 - 44 U/L   Alkaline Phosphatase 43 38 - 126 U/L   Total Bilirubin 0.8 0.3 - 1.2 mg/dL   GFR, Estimated >81 >19 mL/min    Comment:  (NOTE) Calculated using the CKD-EPI Creatinine Equation (2021)    Anion gap 9 5 - 15    Comment: Performed at [facility], [address] [place], [place] [address]  CBC     Status: Abnormal   Collection Time: 01/12/23  8:18 AM  Result Value Ref Range   WBC 6.2 4.0 - 10.5 K/uL   RBC 3.88 3.87 - 5.11 MIL/uL   Hemoglobin 12.5 12.0 - 15.0 g/dL   HCT 95.6 21.3 - 08.6 %   MCV 93.8 80.0 - 100.0 fL   MCH 32.2 26.0 - 34.0 pg   MCHC 34.3 30.0 - 36.0 g/dL   RDW 57.8 (H) 46.9 - 62.9 %   Platelets 198 150 - 400 K/uL   nRBC 0.0 0.0 - 0.2 %    Comment: Performed at [facility], [address], [place], [place] [address]  Magnesium     Status: None   Collection Time: 01/12/23  8:18 AM  Result Value Ref Range   Magnesium 2.0 1.7 - 2.4 mg/dL    Comment: Performed at [facility], [address] [place], [place] [address]  Lipase, blood     Status: Abnormal   Collection Time: 01/12/23  8:18 AM  Result Value Ref Range   Lipase 370 (H) 11 - 51 U/L    Comment: Performed at [facility], [address] [place], [place] [address]  Comprehensive metabolic panel     Status: Abnormal   Collection Time: 01/13/23  3:48 AM  Result Value Ref Range   Sodium 137 135 - 145 mmol/L   Potassium 3.5 3.5 - 5.1 mmol/L   Chloride 107 98 - 111 mmol/L   CO2 22 22 - 32 mmol/L   Glucose, Bld 96 70 - 99 mg/dL    Comment: Glucose reference range applies only to samples taken after fasting for at least 8 hours.   BUN 5 (L) 6 - 20 mg/dL   Creatinine, Ser 5.36 0.44 - 1.00 mg/dL   Calcium 8.7 (L) 8.9 - 10.3 mg/dL   Total Protein 5.5 (L) 6.5 - 8.1 g/dL   Albumin 2.9 (L) 3.5 - 5.0 g/dL   AST 32 15 - 41 U/L   ALT 103 (H) 0 - 44 U/L   Alkaline Phosphatase 43 38 - 126 U/L   Total Bilirubin 0.8 0.3 - 1.2 mg/dL   GFR, Estimated >64 >40 mL/min    Comment: (NOTE) Calculated using the CKD-EPI Creatinine Equation (2021)    Anion gap 8 5 - 15     Comment: Performed at [facility], [address] [place], [place] [address]  CBC     Status: Abnormal   Collection Time: 01/13/23  3:48 AM  Result Value Ref Range   WBC 6.4 4.0 - 10.5 K/uL   RBC 3.90 3.87 - 5.11 MIL/uL   Hemoglobin 12.2 12.0 - 15.0 g/dL   HCT 60.4 54.0 - 98.1 %   MCV 94.4 80.0 - 100.0 fL   MCH 31.3 26.0 - 34.0 pg   MCHC 33.2 30.0 - 36.0 g/dL   RDW 19.1 (H) 47.8 - 29.5 %   Platelets 170 150 - 400 K/uL   nRBC 0.0 0.0 - 0.2 %    Comment: Performed at [facility], [address] [place], [place] [address]  Lipase, blood     Status: Abnormal   Collection Time: 01/13/23  3:48 AM  Result Value Ref Range   Lipase 436 (H) 11 - 51 U/L    Comment: RESULT CONFIRMED BY MANUAL DILUTION Performed at [facility], [address], [place], [place] [address]   Phosphorus     Status: None   Collection Time: 01/13/23  3:48 AM  Result Value Ref Range   Phosphorus 2.7 2.5 - 4.6 mg/dL    Comment: Performed at [facility], [address], [place], [place] [address]    No results found.  Review of Systems Blood pressure 116/75, pulse (!) 46, temperature 98.6 F (37 C), temperature source Oral, resp. rate 15, height 5' (1.524 m), weight 73.8 kg, SpO2 100%. Physical Exam Constitutional:      Appearance: Normal appearance.  HENT:     Head: Normocephalic and atraumatic.     Mouth/Throat:     Mouth: Mucous membranes are moist.  Eyes:     Extraocular Movements: Extraocular movements intact.     Pupils: Pupils are equal, round, and reactive to light.  Cardiovascular:     Rate and Rhythm: Normal rate and regular rhythm.     Pulses: Normal pulses.     Heart sounds: Normal heart sounds.  Pulmonary:     Effort: Pulmonary effort is normal.     Breath sounds: Normal breath sounds.  Abdominal:     General: Abdomen is flat. There is no distension.     Palpations: Abdomen is soft.     Tenderness: There is  no abdominal tenderness. There is no guarding.  Musculoskeletal:     Cervical back: Normal range of motion and neck supple.  Skin:    General: Skin is warm and dry.  Neurological:     General: No focal deficit present.     Mental Status: She is alert and oriented to person, place, and time.  Psychiatric:        Mood and Affect: Mood normal.        Behavior: Behavior normal.        Thought Content: Thought content normal.        Judgment: Judgment normal.   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  The clinical picture somewhat confusing with increased LFT's-which are now trending downwards. 2) Cholelithiasis with no evidence of choledocholithiasis-some patients with cholelithiasis can present with nausea and vomiting without abdominal pain.  It be helpful to get an opinion from the surgeons. 3) Elevated liver enzymes. 4) Asthma. 5) Elevated lipase levels-noting this patient has any evidence of pancreatitis even though her lipase level is elevated 3 times abdomen around  normal; the CT scan shows no evidence of pancreatic inflammation which is also reassuring.  Charna Elizabeth 01/13/2023, 2:57 PM

## 2023-01-13 NOTE — TOC CM/SW Note (Signed)
Transition of Care [facility]) - Inpatient Brief Assessment   Patient Details  Name: TRENIECE HOLSCLAW MRN: [id number] Date of Birth: 01-03-2001  Transition of Care [facility]) CM/SW Contact:    Larrie Kass, LCSW Phone Number: 01/13/2023, 9:27 AM   Clinical Narrative:    Transition of Care Asessment: Insurance and Status: Insurance coverage has been reviewed Patient has primary care physician: No ([facility] [employer] and wellness) Home environment has been reviewed: home with self Prior level of function:: independent Prior/Current Home Services: No current home services Social Determinants of Health Reivew: SDOH reviewed no interventions necessary Readmission risk has been reviewed: Yes Transition of care needs: no transition of care needs at this time

## 2023-01-13 NOTE — Plan of Care (Signed)
  Problem: Health Behavior/Discharge Planning: Goal: Ability to manage health-related needs will improve Outcome: Progressing   Problem: Nutrition: Goal: Adequate nutrition will be maintained Outcome: Progressing   Problem: Coping: Goal: Level of anxiety will decrease Outcome: Progressing   

## 2023-01-14 DIAGNOSIS — F4320 Adjustment disorder, unspecified: Secondary | ICD-10-CM | POA: Diagnosis present

## 2023-01-14 DIAGNOSIS — E874 Mixed disorder of acid-base balance: Secondary | ICD-10-CM | POA: Diagnosis present

## 2023-01-14 DIAGNOSIS — R7401 Elevation of levels of liver transaminase levels: Secondary | ICD-10-CM | POA: Diagnosis present

## 2023-01-14 DIAGNOSIS — E669 Obesity, unspecified: Secondary | ICD-10-CM | POA: Diagnosis present

## 2023-01-14 DIAGNOSIS — R112 Nausea with vomiting, unspecified: Secondary | ICD-10-CM | POA: Diagnosis not present

## 2023-01-14 DIAGNOSIS — E876 Hypokalemia: Secondary | ICD-10-CM | POA: Diagnosis present

## 2023-01-14 DIAGNOSIS — Z8249 Family history of ischemic heart disease and other diseases of the circulatory system: Secondary | ICD-10-CM | POA: Diagnosis not present

## 2023-01-14 DIAGNOSIS — E871 Hypo-osmolality and hyponatremia: Secondary | ICD-10-CM | POA: Diagnosis present

## 2023-01-14 DIAGNOSIS — J45909 Unspecified asthma, uncomplicated: Secondary | ICD-10-CM | POA: Diagnosis present

## 2023-01-14 DIAGNOSIS — K8020 Calculus of gallbladder without cholecystitis without obstruction: Secondary | ICD-10-CM | POA: Diagnosis present

## 2023-01-14 DIAGNOSIS — Z23 Encounter for immunization: Secondary | ICD-10-CM | POA: Diagnosis not present

## 2023-01-14 DIAGNOSIS — Z1152 Encounter for screening for COVID-19: Secondary | ICD-10-CM | POA: Diagnosis not present

## 2023-01-14 DIAGNOSIS — E878 Other disorders of electrolyte and fluid balance, not elsewhere classified: Secondary | ICD-10-CM | POA: Diagnosis present

## 2023-01-14 DIAGNOSIS — K3189 Other diseases of stomach and duodenum: Secondary | ICD-10-CM | POA: Diagnosis present

## 2023-01-14 DIAGNOSIS — E873 Alkalosis: Secondary | ICD-10-CM | POA: Diagnosis present

## 2023-01-14 DIAGNOSIS — N179 Acute kidney failure, unspecified: Secondary | ICD-10-CM | POA: Diagnosis present

## 2023-01-14 DIAGNOSIS — I959 Hypotension, unspecified: Secondary | ICD-10-CM | POA: Diagnosis present

## 2023-01-14 DIAGNOSIS — Z6831 Body mass index (BMI) 31.0-31.9, adult: Secondary | ICD-10-CM | POA: Diagnosis not present

## 2023-01-14 DIAGNOSIS — F1290 Cannabis use, unspecified, uncomplicated: Secondary | ICD-10-CM | POA: Diagnosis present

## 2023-01-14 DIAGNOSIS — E860 Dehydration: Secondary | ICD-10-CM | POA: Diagnosis present

## 2023-01-14 DIAGNOSIS — K859 Acute pancreatitis without necrosis or infection, unspecified: Secondary | ICD-10-CM | POA: Diagnosis present

## 2023-01-14 DIAGNOSIS — R9431 Abnormal electrocardiogram [ECG] [EKG]: Secondary | ICD-10-CM | POA: Diagnosis present

## 2023-01-14 LAB — COMPREHENSIVE METABOLIC PANEL
ALT: 84 U/L — ABNORMAL HIGH (ref 0–44)
AST: 25 U/L (ref 15–41)
Albumin: 3.1 g/dL — ABNORMAL LOW (ref 3.5–5.0)
Alkaline Phosphatase: 43 U/L (ref 38–126)
Anion gap: 9 (ref 5–15)
BUN: <5 mg/dL — ABNORMAL LOW (ref 6–20)
CO2: 20 mmol/L — ABNORMAL LOW (ref 22–32)
Calcium: 8.6 mg/dL — ABNORMAL LOW (ref 8.9–10.3)
Chloride: 105 mmol/L (ref 98–111)
Creatinine, Ser: 0.78 mg/dL (ref 0.44–1.00)
GFR, Estimated: >60 mL/min (ref >60)
Glucose, Bld: 95 mg/dL (ref 70–99)
Potassium: 3.3 mmol/L — ABNORMAL LOW (ref 3.5–5.1)
Sodium: 134 mmol/L — ABNORMAL LOW (ref 135–145)
Total Bilirubin: 1 mg/dL (ref 0.3–1.2)
Total Protein: 5.9 g/dL — ABNORMAL LOW (ref 6.5–8.1)

## 2023-01-14 LAB — LIPASE, BLOOD: Lipase: 322 U/L — ABNORMAL HIGH (ref 11–51)

## 2023-01-14 LAB — CBC
HCT: 37.2 % (ref 36.0–46.0)
Hemoglobin: 12.6 g/dL (ref 12.0–15.0)
MCH: 31.1 pg (ref 26.0–34.0)
MCHC: 33.9 g/dL (ref 30.0–36.0)
MCV: 91.9 fL (ref 80.0–100.0)
Platelets: 162 10*3/uL (ref 150–400)
RBC: 4.05 MIL/uL (ref 3.87–5.11)
RDW: 15.9 % — ABNORMAL HIGH (ref 11.5–15.5)
WBC: 6.1 10*3/uL (ref 4.0–10.5)
nRBC: 0 % (ref 0.0–0.2)

## 2023-01-14 LAB — GLUCOSE, CAPILLARY: Glucose-Capillary: 95 mg/dL (ref 70–99)

## 2023-01-14 MED ORDER — POTASSIUM CHLORIDE IN NACL 20-0.9 MEQ/L-% IV SOLN
INTRAVENOUS | Status: DC
  Filled 2023-01-14 (×3): qty 1000

## 2023-01-14 MED ORDER — MELATONIN 5 MG PO TABS
5.0000 mg | ORAL_TABLET | Freq: Once | ORAL | Status: AC
  Administered 2023-01-14: 5 mg via ORAL
  Filled 2023-01-14: qty 1

## 2023-01-14 MED ORDER — POTASSIUM CHLORIDE 10 MEQ/100ML IV SOLN
10.0000 meq | INTRAVENOUS | Status: AC
  Administered 2023-01-14 (×3): 10 meq via INTRAVENOUS
  Filled 2023-01-14 (×3): qty 100

## 2023-01-14 NOTE — Progress Notes (Signed)
Subjective: Nausea persists, but overall she is feeling better.  Objective: Vital signs in last 24 hours: Temp:  [98 F (36.7 C)-99 F (37.2 C)] 99 F (37.2 C) (10/01 1417) Pulse Rate:  [46-54] 54 (10/01 1417) Resp:  [16-18] 16 (10/01 1417) BP: (117-122)/(73-91) 122/73 (10/01 1417) SpO2:  [99 %-100 %] 99 % (10/01 1417) Weight:  [72.9 kg] 72.9 kg (10/01 0500) Last BM Date : 01/13/23  Intake/Output from previous day: 09/30 0701 - 10/01 0700 In: 2659.8 [P.O.:120; I.V.:2539.8] Out: -  Intake/Output this shift: No intake/output data recorded.  General appearance: alert and no distress Resp: clear to auscultation bilaterally Cardio: regular rate and rhythm GI: soft, non-tender; bowel sounds normal; no masses,  no organomegaly Extremities: extremities normal, atraumatic, no cyanosis or edema  Lab Results: Recent Labs    01/12/23 0818 01/13/23 0348 01/14/23 0431  WBC 6.2 6.4 6.1  HGB 12.5 12.2 12.6  HCT 36.4 36.8 37.2  PLT 198 170 162   BMET Recent Labs    01/12/23 0818 01/13/23 0348 01/14/23 0431  NA 131* 137 134*  K 3.4* 3.5 3.3*  CL 101 107 105  CO2 21* 22 20*  GLUCOSE 107* 96 95  BUN 8 5* <5*  CREATININE 0.68 0.70 0.78  CALCIUM 8.4* 8.7* 8.6*   LFT Recent Labs    01/14/23 0431  PROT 5.9*  ALBUMIN 3.1*  AST 25  ALT 84*  ALKPHOS 43  BILITOT 1.0   PT/INR No results for input(s): "LABPROT", "INR" in the last 72 hours. Hepatitis Panel No results for input(s): "HEPBSAG", "HCVAB", "HEPAIGM", "HEPBIGM" in the last 72 hours. C-Diff No results for input(s): "CDIFFTOX" in the last 72 hours. Fecal Lactopherrin No results for input(s): "FECLLACTOFRN" in the last 72 hours.  Studies/Results: No results found.  Medications: Scheduled:  enoxaparin (LOVENOX) injection  40 mg Subcutaneous Q24H   sodium chloride flush  3 mL Intravenous Q12H   Continuous:  sodium chloride Stopped (01/12/23 0518)   0.9 % NaCl with KCl 20 mEq / L      Assessment/Plan: 1)  Nausea and vomiting - improving. 2) Cholelithiasis. 3) Abnormal liver enzymes. 4) Pancreatitis.   The suspicion is that she passed a gallstone.  She is improving.  Surgery evaluated her and offered a lap chole, but she is not certain about the surgery.  There is no evidence of choledocholithiasis.  Plan: 1) Continue supportive care. 2) Recommended lap chole. 3) Signing off.  LOS: 0 days   Qianna Clagett D 01/14/2023, 3:59 PM

## 2023-01-14 NOTE — Progress Notes (Signed)
PROGRESS NOTE  Carolyn Montgomery UEA:[id number] DOB: July 29, 2000 DOA: 01/10/2023 PCP: Pcp, No  HPI/Recap of past 24 hours: Carolyn Montgomery is a 22 y.o. female with medical history significant of adjustment disorder and asthma presented to emergency department for complaint of vomiting for 1 week.  She tried Pepto-Bismol at home without any improvement.  Denies any use of illicit drug and smoking marijuana.  UDS positive for THC.  Lab studies were notable for elevated lipase which is now downtrending 322 from peak of 436.  Also, notable for elevated LFTs that are downtrending.  Hospital course complicated by recurrent nausea with vomiting for which GI and general surgery were consulted.  01/14/23: The patient was seen and examined at bedside.  Her mother was present in the room.  Endorses nausea and vomiting x 1 today.  Also endorses blurry vision which is new for her, although the patient is able to read small-medium print from a distance.  No focal neurological changes.   Assessment/Plan: Principal Problem:   Intractable nausea and vomiting Active Problems:   Hypokalemia   Elevated lipase   Cholelithiasis   Hyponatremia   Hypochloremia   Metabolic acidosis   Metabolic alkalosis  Intractable nausea and vomiting, persistent. Possibly related to [facility] use versus cholelithiasis with no evidence of cholecystitis. Seen by GI and general surgery. Continue IV fluid hydration Continue IV antiemetics as needed. Was advanced to soft diet at the patient's own request.  Improving, elevated lipase level Cholelithiasis without evidence of cholecystitis or choledocholithiasis Denies having any abdominal pain-no evidence of acute pancreatitis on CT scan. Nausea is persistent Lipase is downtrending 322 from peak of 436, 370, 26.1 The patient has requested a solid diet despite nausea. Appreciate GI and general surgery's assistance.  Refractory hypokalemia, resolved post repletion. Serum  potassium 3.3 Repleted intravenously Magnesium level 2.0. Added NS KCl 20 mill equivalent at 100 cc/h x 1 day.  Resolved hypophosphatemia secondary to poor oral intake, resolved post repletion. Serum phosphorus 1.9>> 2.7.  Mild non anion gap metabolic acidosis Serum bicarb 20 Continue IV fluid hydration  Mild hyponatremia, likely secondary to poor oral intake Serum sodium 134 Continue IV fluid hydration, NS KCl 20 mill equivalent at 100 cc/h x 1 day.  THC use UDS positive for THC Recommend complete cessation  Prolonged QTc QTc greater than 500 Avoid QTc prolonging agents Continue to optimize magnesium and potassium levels  Obesity BMI 31 Recommend weight loss outpatient regular physical activity and healthy dieting.  Vision blurriness Unclear etiology On exam the patient was able to read small to medium print from a few feet distance. No focal neurological deficits. Recommend follow-up with ophthalmologist if persists  Time: 55 minutes.  Code Status: Full code  Family Communication: Updated the patient's mother at bedside.  Disposition Plan: Likely will discharge tomorrow 01/15/2023 or when general surgery signs off.   Consultants: GI General surgery.  Procedures: None.  Antimicrobials: None.  DVT prophylaxis: Subcu Lovenox daily  Status is: Observation     Objective: Vitals:   01/13/23 2036 01/14/23 0500 01/14/23 0605 01/14/23 0918  BP: (!) 117/91  121/77 119/75  Pulse: (!) 46   (!) 48  Resp: 18  17 16   Temp: 98 F (36.7 C)  98.2 F (36.8 C) 98.7 F (37.1 C)  TempSrc: Oral  Oral Oral  SpO2: 100%  100% 100%  Weight:  72.9 kg    Height:        Intake/Output Summary (Last 24 hours) at 01/14/2023 1325  Last data filed at 01/14/2023 0300 Gross per 24 hour  Intake 2659.84 ml  Output --  Net 2659.84 ml   Filed Weights   01/12/23 0500 01/13/23 0500 01/14/23 0500  Weight: 73.9 kg 73.8 kg 72.9 kg    Exam:  General: 22 y.o. year-old female  well-developed well-nourished in no acute distress.  She is alert and oriented x 3.   Cardiovascular: Regular rate and rhythm no rubs or gallops.   Respiratory: Clear to auscultation with no wheezes or rales. Good inspiratory effort. Abdomen: Soft nontender with normal bowel sounds present. Musculoskeletal: No lower extremity edema bilaterally. Skin: No ulcerative lesions noted or rashes, Psychiatry: Mood is appropriate for condition setting.   Data Reviewed: CBC: Recent Labs  Lab 01/10/23 1546 01/10/23 1756 01/11/23 0525 01/12/23 0818 01/13/23 0348 01/14/23 0431  WBC 11.5*  --  8.1 6.2 6.4 6.1  HGB 17.5* 18.4* 11.8* 12.5 12.2 12.6  HCT 48.6* 54.0* 34.8* 36.4 36.8 37.2  MCV 87.7  --  92.3 93.8 94.4 91.9  PLT 425*  --  213 198 170 162   Basic Metabolic Panel: Recent Labs  Lab 01/10/23 1744 01/10/23 1756 01/11/23 0525 01/12/23 0011 01/12/23 0818 01/13/23 0348 01/14/23 0431  NA  --    < > 134* 137 131* 137 134*  K  --    < > 2.6* 3.9 3.4* 3.5 3.3*  CL  --   --  97* 104 101 107 105  CO2  --   --  26 24 21* 22 20*  GLUCOSE  --   --  106* 92 107* 96 95  BUN  --   --  16 9 8  5* <5*  CREATININE  --   --  1.14* 0.85 0.68 0.70 0.78  CALCIUM  --   --  8.5* 8.9 8.4* 8.7* 8.6*  MG 2.7*  --   --   --  2.0  --   --   PHOS  --   --  2.5 1.9*  --  2.7  --    < > = values in this interval not displayed.   GFR: Estimated Creatinine Clearance: 98.4 mL/min (by C-G formula based on SCr of 0.78 mg/dL). Liver Function Tests: Recent Labs  Lab 01/10/23 1546 01/11/23 0525 01/12/23 0818 01/13/23 0348 01/14/23 0431  AST 109* 58* 36 32 25  ALT 311* 171* 122* 103* 84*  ALKPHOS 69 45 43 43 43  BILITOT 0.9 0.8 0.8 0.8 1.0  PROT 8.8* 5.5* 5.9* 5.5* 5.9*  ALBUMIN 4.9 2.9* 3.1* 2.9* 3.1*   Recent Labs  Lab 01/10/23 1546 01/12/23 0818 01/13/23 0348 01/14/23 0430  LIPASE 261* 370* 436* 322*   No results for input(s): "AMMONIA" in the last 168 hours. Coagulation Profile: Recent  Labs  Lab 01/10/23 1744  INR 1.0   Cardiac Enzymes: No results for input(s): "CKTOTAL", "CKMB", "CKMBINDEX", "TROPONINI" in the last 168 hours. BNP (last 3 results) No results for input(s): "PROBNP" in the last 8760 hours. HbA1C: No results for input(s): "HGBA1C" in the last 72 hours. CBG: No results for input(s): "GLUCAP" in the last 168 hours. Lipid Profile: No results for input(s): "CHOL", "HDL", "LDLCALC", "TRIG", "CHOLHDL", "LDLDIRECT" in the last 72 hours. Thyroid Function Tests: No results for input(s): "TSH", "T4TOTAL", "FREET4", "T3FREE", "THYROIDAB" in the last 72 hours.  Anemia Panel: No results for input(s): "VITAMINB12", "FOLATE", "FERRITIN", "TIBC", "IRON", "RETICCTPCT" in the last 72 hours. Urine analysis:    Component Value Date/Time   COLORURINE YELLOW 01/10/2023  1744   APPEARANCEUR CLEAR 01/10/2023 1744   LABSPEC 1.026 01/10/2023 1744   PHURINE 6.5 01/10/2023 1744   GLUCOSEU NEGATIVE 01/10/2023 1744   HGBUR NEGATIVE 01/10/2023 1744   BILIRUBINUR NEGATIVE 01/10/2023 1744   KETONESUR 15 (A) 01/10/2023 1744   PROTEINUR 30 (A) 01/10/2023 1744   UROBILINOGEN 0.2 12/25/2013 2143   NITRITE NEGATIVE 01/10/2023 1744   LEUKOCYTESUR NEGATIVE 01/10/2023 1744   Sepsis Labs: @LABRCNTIP (procalcitonin:4,lacticidven:4)  ) Recent Results (from the past 240 hour(s))  SARS Coronavirus 2 by RT PCR (hospital order, performed in [facility] hospital lab) *cepheid single result test* Anterior Nasal Swab     Status: None   Collection Time: 01/10/23  3:46 PM   Specimen: Anterior Nasal Swab  Result Value Ref Range Status   SARS Coronavirus 2 by RT PCR NEGATIVE NEGATIVE Final    Comment: (NOTE) SARS-CoV-2 target nucleic acids are NOT DETECTED.  The SARS-CoV-2 RNA is generally detectable in upper and lower respiratory specimens during the acute phase of infection. The lowest concentration of SARS-CoV-2 viral copies this assay can detect is 250 copies / mL. A negative result  does not preclude SARS-CoV-2 infection and should not be used as the sole basis for treatment or other patient management decisions.  A negative result may occur with improper specimen collection / handling, submission of specimen other than nasopharyngeal swab, presence of viral mutation(s) within the areas targeted by this assay, and inadequate number of viral copies (<250 copies / mL). A negative result must be combined with clinical observations, patient history, and epidemiological information.  Fact Sheet for Patients:   RoadLapTop.co.za  Fact Sheet for Healthcare Providers: http://kim-miller.com/  This test is not yet approved or  cleared by the [place] FDA and has been authorized for detection and/or diagnosis of SARS-CoV-2 by FDA under an Emergency Use Authorization (EUA).  This EUA will remain in effect (meaning this test can be used) for the duration of the COVID-19 declaration under Section 564(b)(1) of the Act, 21 U.S.C. section 360bbb-3(b)(1), unless the authorization is terminated or revoked sooner.  Performed at [facility], [address] [place], [place] [address]   Resp panel by RT-PCR (RSV, Flu A&B, Covid) Anterior Nasal Swab     Status: None   Collection Time: 01/10/23  3:46 PM   Specimen: Anterior Nasal Swab  Result Value Ref Range Status   SARS Coronavirus 2 by RT PCR NEGATIVE NEGATIVE Final    Comment: (NOTE) SARS-CoV-2 target nucleic acids are NOT DETECTED.  The SARS-CoV-2 RNA is generally detectable in upper respiratory specimens during the acute phase of infection. The lowest concentration of SARS-CoV-2 viral copies this assay can detect is 138 copies/mL. A negative result does not preclude SARS-Cov-2 infection and should not be used as the sole basis for treatment or other patient management decisions. A negative result may occur with  improper specimen collection/handling,  submission of specimen other than nasopharyngeal swab, presence of viral mutation(s) within the areas targeted by this assay, and inadequate number of viral copies(<138 copies/mL). A negative result must be combined with clinical observations, patient history, and epidemiological information. The expected result is Negative.  Fact Sheet for Patients:  BloggerCourse.com  Fact Sheet for Healthcare Providers:  SeriousBroker.it  This test is no t yet approved or cleared by the [place] FDA and  has been authorized for detection and/or diagnosis of SARS-CoV-2 by FDA under an Emergency Use Authorization (EUA). This EUA will remain  in effect (meaning this test can be  used) for the duration of the COVID-19 declaration under Section 564(b)(1) of the Act, 21 U.S.C.section 360bbb-3(b)(1), unless the authorization is terminated  or revoked sooner.       Influenza A by PCR NEGATIVE NEGATIVE Final   Influenza B by PCR NEGATIVE NEGATIVE Final    Comment: (NOTE) The Xpert Xpress SARS-CoV-2/FLU/RSV plus assay is intended as an aid in the diagnosis of influenza from Nasopharyngeal swab specimens and should not be used as a sole basis for treatment. Nasal washings and aspirates are unacceptable for Xpert Xpress SARS-CoV-2/FLU/RSV testing.  Fact Sheet for Patients: BloggerCourse.com  Fact Sheet for Healthcare Providers: SeriousBroker.it  This test is not yet approved or cleared by the [place] FDA and has been authorized for detection and/or diagnosis of SARS-CoV-2 by FDA under an Emergency Use Authorization (EUA). This EUA will remain in effect (meaning this test can be used) for the duration of the COVID-19 declaration under Section 564(b)(1) of the Act, 21 U.S.C. section 360bbb-3(b)(1), unless the authorization is terminated or revoked.     Resp Syncytial Virus by PCR NEGATIVE  NEGATIVE Final    Comment: (NOTE) Fact Sheet for Patients: BloggerCourse.com  Fact Sheet for Healthcare Providers: SeriousBroker.it  This test is not yet approved or cleared by the [place] FDA and has been authorized for detection and/or diagnosis of SARS-CoV-2 by FDA under an Emergency Use Authorization (EUA). This EUA will remain in effect (meaning this test can be used) for the duration of the COVID-19 declaration under Section 564(b)(1) of the Act, 21 U.S.C. section 360bbb-3(b)(1), unless the authorization is terminated or revoked.  Performed at [facility], [address] [place], [place] [address]   Blood Culture (routine x 2)     Status: None (Preliminary result)   Collection Time: 01/10/23  5:02 PM   Specimen: BLOOD  Result Value Ref Range Status   Specimen Description   Final    BLOOD LEFT ANTECUBITAL Performed at [facility], [address] [place], [place] [address]    Special Requests   Final    BOTTLES DRAWN AEROBIC AND ANAEROBIC Blood Culture adequate volume Performed at [facility], [address], [place], [place] [address]    Culture   Final    NO GROWTH 4 DAYS Performed at [facility], [address], [place], [place] [address]    Report Status PENDING  Incomplete  Blood Culture (routine x 2)     Status: None (Preliminary result)   Collection Time: 01/10/23  5:07 PM   Specimen: BLOOD  Result Value Ref Range Status   Specimen Description   Final    BLOOD BLOOD LEFT FOREARM Performed at [facility], [address] [place], [place] [address]    Special Requests   Final    BOTTLES DRAWN AEROBIC AND ANAEROBIC Blood Culture adequate volume Performed at [facility], [address], [place], [place] [address]    Culture   Final    NO GROWTH 4 DAYS Performed at [facility], [address] [place], [place] [address]    Report Status PENDING  Incomplete  Urine Culture     Status: None   Collection Time: 01/10/23  5:44 PM   Specimen: Urine, Random  Result Value Ref Range Status   Specimen Description   Final    URINE, RANDOM Performed at [facility], [address], [place], [place] [address]    Special Requests   Final  NONE Reflexed from [id number] Performed at [facility], [address], [place], [place] [address]    Culture   Final    NO GROWTH Performed at [facility], [address], [place], [place] [address]    Report Status 01/12/2023 FINAL  Final      Studies: No results found.  Scheduled Meds:  enoxaparin (LOVENOX) injection  40 mg Subcutaneous Q24H   sodium chloride flush  3 mL Intravenous Q12H    Continuous Infusions:  sodium chloride Stopped (01/12/23 0518)   potassium chloride 10 mEq (01/14/23 1241)     LOS: 0 days     Darlin Drop, MD [facility] Pager [phone]  If 7PM-7AM, please contact night-coverage [email or web address] Password [email or web address] 01/14/2023, 1:25 PM

## 2023-01-14 NOTE — Consult Note (Signed)
Consult Note  Carolyn Montgomery 08/20/2000  [id number].    Requesting MD: Dow Adolph, DO Chief Complaint/Reason for Consult: nausea and vomiting  HPI:  Patient is a 22 year old female who presented to the hospital with nausea and vomiting x1 week. Emesis is bilious but not bloody and no coffee-ground appearing material. Vomits after eating/drinking but timing of this is variable - sometimes within several minutes, sometimes an hour or so later. She denies abdominal pain. She is having bowel function although reports stools are loose currently. She smokes marijuana on the weekends but not daily, she drinks alcohol on the weekends but not daily. PMH otherwise significant for asthma and adjustment disorder. No prior abdominal surgery. NKDA and does not take any daily medications. Mother at bedside as well.   ROS: Negative other than HPI  Family History  Problem Relation Age of Onset   Hypertension Maternal Grandfather    Hypertension Paternal Grandfather     Past Medical History:  Diagnosis Date   Asthma    Urinary tract infection     History reviewed. No pertinent surgical history.  Social History:  reports that she has never smoked. She has never used smokeless tobacco. She reports current alcohol use. She reports that she does not use drugs.  Allergies: No Known Allergies  No medications prior to admission.    Blood pressure 119/75, pulse (!) 48, temperature 98.7 F (37.1 C), temperature source Oral, resp. rate 16, height 5' (1.524 m), weight 72.9 kg, SpO2 100%. Physical Exam:  General: pleasant, WD, overweight female who is laying in bed in NAD HEENT: head is normocephalic, atraumatic.  Sclera are anicteric, EOMI.  Ears and nose without any masses or lesions.  Mouth is pink and moist Heart: regular, rate, and rhythm.  Normal s1,s2. No obvious murmurs, gallops, or rubs noted.  Lungs: CTAB, no wheezes, rhonchi, or rales noted.  Respiratory effort nonlabored Abd:  soft, NT, ND, +BS, no masses, hernias, or organomegaly MS: all 4 extremities are symmetrical with no cyanosis, clubbing, or edema. Skin: warm and dry with no masses, lesions, or rashes Neuro: Cranial nerves 2-12 grossly intact, sensation is normal throughout Psych: A&Ox3 with an appropriate affect.   Results for orders placed or performed during the hospital encounter of 01/10/23 (from the past 48 hour(s))  Comprehensive metabolic panel     Status: Abnormal   Collection Time: 01/13/23  3:48 AM  Result Value Ref Range   Sodium 137 135 - 145 mmol/L   Potassium 3.5 3.5 - 5.1 mmol/L   Chloride 107 98 - 111 mmol/L   CO2 22 22 - 32 mmol/L   Glucose, Bld 96 70 - 99 mg/dL    Comment: Glucose reference range applies only to samples taken after fasting for at least 8 hours.   BUN 5 (L) 6 - 20 mg/dL   Creatinine, Ser 8.41 0.44 - 1.00 mg/dL   Calcium 8.7 (L) 8.9 - 10.3 mg/dL   Total Protein 5.5 (L) 6.5 - 8.1 g/dL   Albumin 2.9 (L) 3.5 - 5.0 g/dL   AST 32 15 - 41 U/L   ALT 103 (H) 0 - 44 U/L   Alkaline Phosphatase 43 38 - 126 U/L   Total Bilirubin 0.8 0.3 - 1.2 mg/dL   GFR, Estimated >32 >44 mL/min    Comment: (NOTE) Calculated using the CKD-EPI Creatinine Equation (2021)    Anion gap 8 5 - 15    Comment: Performed at [facility], [address], [place], [place] [address]  CBC     Status: Abnormal   Collection Time: 01/13/23  3:48 AM  Result Value Ref Range   WBC 6.4 4.0 - 10.5 K/uL   RBC 3.90 3.87 - 5.11 MIL/uL   Hemoglobin 12.2 12.0 - 15.0 g/dL   HCT 56.2 13.0 - 86.5 %   MCV 94.4 80.0 - 100.0 fL   MCH 31.3 26.0 - 34.0 pg   MCHC 33.2 30.0 - 36.0 g/dL   RDW 78.4 (H) 69.6 - 29.5 %   Platelets 170 150 - 400 K/uL   nRBC 0.0 0.0 - 0.2 %    Comment: Performed at [facility], [address], [place], [place] [address]  Lipase, blood     Status: Abnormal   Collection Time: 01/13/23  3:48 AM  Result Value Ref Range   Lipase 436 (H) 11 - 51 U/L     Comment: RESULT CONFIRMED BY MANUAL DILUTION Performed at [facility], [address], [place], [place] [address]   Phosphorus     Status: None   Collection Time: 01/13/23  3:48 AM  Result Value Ref Range   Phosphorus 2.7 2.5 - 4.6 mg/dL    Comment: Performed at [facility], [address], [place], [place] [address]  Lipase, blood     Status: Abnormal   Collection Time: 01/14/23  4:30 AM  Result Value Ref Range   Lipase 322 (H) 11 - 51 U/L    Comment: Performed at [facility], [address], [place], [place] [address]  Comprehensive metabolic panel     Status: Abnormal   Collection Time: 01/14/23  4:31 AM  Result Value Ref Range   Sodium 134 (L) 135 - 145 mmol/L   Potassium 3.3 (L) 3.5 - 5.1 mmol/L   Chloride 105 98 - 111 mmol/L   CO2 20 (L) 22 - 32 mmol/L   Glucose, Bld 95 70 - 99 mg/dL    Comment: Glucose reference range applies only to samples taken after fasting for at least 8 hours.   BUN <5 (L) 6 - 20 mg/dL   Creatinine, Ser 0.34 0.44 - 1.00 mg/dL   Calcium 8.6 (L) 8.9 - 10.3 mg/dL   Total Protein 5.9 (L) 6.5 - 8.1 g/dL   Albumin 3.1 (L) 3.5 - 5.0 g/dL   AST 25 15 - 41 U/L   ALT 84 (H) 0 - 44 U/L   Alkaline Phosphatase 43 38 - 126 U/L   Total Bilirubin 1.0 0.3 - 1.2 mg/dL   GFR, Estimated >74 >25 mL/min    Comment: (NOTE) Calculated using the CKD-EPI Creatinine Equation (2021)    Anion gap 9 5 - 15    Comment: Performed at [facility], [address], [place], [place] [address]  CBC     Status: Abnormal   Collection Time: 01/14/23  4:31 AM  Result Value Ref Range   WBC 6.1 4.0 - 10.5 K/uL   RBC 4.05 3.87 - 5.11 MIL/uL   Hemoglobin 12.6 12.0 - 15.0 g/dL   HCT 75.6 43.3 - 29.5 %   MCV 91.9 80.0 - 100.0 fL   MCH 31.1 26.0 - 34.0 pg   MCHC 33.9 30.0 - 36.0 g/dL   RDW 18.8 (H) 41.6 - 60.6 %   Platelets 162 150 - 400 K/uL   nRBC 0.0 0.0 - 0.2 %    Comment: Performed at [facility], [address], [place], [place] [address]   No results found.    Assessment/Plan Intractable nausea and vomiting Cholelithiasis  Elevated lipase and LFTs - Imaging with cholelithiasis without concern for cholecystitis  - No leukocytosis and afebrile  - lipase peaked at 436 yesterday, down to 322 today - Alk Phos has never been elevated, AST mildly elevated on admit and now normalized. ALT 171 on admit and trending down  - UDS positive for THC but patient denies daily use, denies daily alcohol use - seems reasonable to consider laparoscopic cholecystectomy but does not need to be emergent - no evidence of cholecystitis on exam   FEN: soft diet, IVF per TR[facility] VTE: LMWH ID: no current abx   I reviewed Consultant GI notes, hospitalist notes, last 24 h vitals and pain scores, last 48 h intake and output, last 24 h labs and trends, and last 24 h imaging results.   Juliet Rude, [facility] Surgery 01/14/2023, 11:18 AM Please see Amion for pager number during day hours 7:00am-4:30pm

## 2023-01-14 NOTE — Plan of Care (Signed)
  Problem: Education: Goal: Knowledge of General Education information will improve Description: Including pain rating scale, medication(s)/side effects and non-pharmacologic comfort measures Outcome: Progressing   Problem: Clinical Measurements: Goal: Cardiovascular complication will be avoided Outcome: Progressing   Problem: Coping: Goal: Level of anxiety will decrease Outcome: Progressing   

## 2023-01-14 NOTE — Plan of Care (Signed)
  Problem: Clinical Measurements: Goal: Ability to maintain clinical measurements within normal limits will improve Outcome: Progressing Goal: Diagnostic test results will improve Outcome: Progressing Goal: Cardiovascular complication will be avoided Outcome: Progressing   Problem: Nutrition: Goal: Adequate nutrition will be maintained Outcome: Progressing   Problem: Education: Goal: Knowledge of General Education information will improve Description: Including pain rating scale, medication(s)/side effects and non-pharmacologic comfort measures Outcome: Adequate for Discharge   Problem: Clinical Measurements: Goal: Will remain free from infection Outcome: Adequate for Discharge Goal: Respiratory complications will improve Outcome: Adequate for Discharge   Problem: Coping: Goal: Level of anxiety will decrease Outcome: Adequate for Discharge   Problem: Elimination: Goal: Will not experience complications related to bowel motility Outcome: Adequate for Discharge Goal: Will not experience complications related to urinary retention Outcome: Adequate for Discharge   Problem: Pain Managment: Goal: General experience of comfort will improve Outcome: Adequate for Discharge   Problem: Safety: Goal: Ability to remain free from injury will improve Outcome: Adequate for Discharge   Problem: Skin Integrity: Goal: Risk for impaired skin integrity will decrease Outcome: Adequate for Discharge

## 2023-01-15 DIAGNOSIS — R112 Nausea with vomiting, unspecified: Secondary | ICD-10-CM | POA: Diagnosis not present

## 2023-01-15 DIAGNOSIS — F1290 Cannabis use, unspecified, uncomplicated: Secondary | ICD-10-CM | POA: Insufficient documentation

## 2023-01-15 DIAGNOSIS — E669 Obesity, unspecified: Secondary | ICD-10-CM | POA: Insufficient documentation

## 2023-01-15 LAB — CULTURE, BLOOD (ROUTINE X 2)
Culture: NO GROWTH
Culture: NO GROWTH
Special Requests: ADEQUATE
Special Requests: ADEQUATE

## 2023-01-15 LAB — BASIC METABOLIC PANEL
Anion gap: 12 (ref 5–15)
BUN: <5 mg/dL — ABNORMAL LOW (ref 6–20)
CO2: 18 mmol/L — ABNORMAL LOW (ref 22–32)
Calcium: 8.4 mg/dL — ABNORMAL LOW (ref 8.9–10.3)
Chloride: 105 mmol/L (ref 98–111)
Creatinine, Ser: 0.7 mg/dL (ref 0.44–1.00)
GFR, Estimated: >60 mL/min (ref >60)
Glucose, Bld: 85 mg/dL (ref 70–99)
Potassium: 3.8 mmol/L (ref 3.5–5.1)
Sodium: 135 mmol/L (ref 135–145)

## 2023-01-15 MED ORDER — ONDANSETRON HCL 4 MG PO TABS: 4.0000 mg | ORAL_TABLET | Freq: Every day | ORAL | 1 refills | Status: AC | PRN

## 2023-01-15 NOTE — Discharge Summary (Signed)
Physician Discharge Summary  Carolyn Montgomery BJY:[id number] DOB: 10-03-2000 DOA: 01/10/2023  PCP: Pcp, No  Admit date: 01/10/2023 Discharge date: 01/15/2023  Admitted From: Home Disposition:  Home  Recommendations for Outpatient Follow-up:  Follow up with PCP and General Surgery   Discharge Condition: Stable CODE STATUS: Full  Diet recommendation: Regular   Brief/Interim Summary: Carolyn Montgomery is a 22 y.o. female with medical history significant of adjustment disorder and asthma presented to emergency department for complaint of vomiting for 1 week.  She tried Pepto-Bismol at home without any improvement.  Denies any use of illicit drug and smoking marijuana.  UDS positive for THC.  Lab studies were notable for elevated lipase which is now downtrending 322 from peak of 436.  Also, notable for elevated LFTs that are downtrending.   Hospital course complicated by recurrent nausea with vomiting for which GI and general surgery were consulted.  Patient had evidence of cholelithiasis without cholecystitis.  She will follow-up with general surgery as outpatient to further discuss lap chole.  Patient had no evidence of acute pancreatitis.  Lipase trended downward.  Patient's diet was advanced and patient was able to tolerate.  On day of discharge, patient was feeling much better and was discharged home in stable condition.  Discharge Diagnoses:  Principal Problem:   Intractable nausea and vomiting Active Problems:   Hypokalemia   Elevated lipase   Cholelithiasis   Hyponatremia   Hypochloremia   Metabolic acidosis   Metabolic alkalosis   Hypophosphatemia   Marijuana use   Obesity (BMI 30-39.9)  Discharge Instructions  Discharge Instructions     Call MD for:  difficulty breathing, headache or visual disturbances   Complete by: As directed    Call MD for:  extreme fatigue   Complete by: As directed    Call MD for:  persistant dizziness or light-headedness   Complete by: As  directed    Call MD for:  persistant nausea and vomiting   Complete by: As directed    Call MD for:  severe uncontrolled pain   Complete by: As directed    Call MD for:  temperature >100.4   Complete by: As directed    Discharge instructions   Complete by: As directed    You were cared for by a hospitalist during your hospital stay. If you have any questions about your discharge medications or the care you received while you were in the hospital after you are discharged, you can call the unit and ask to speak with the hospitalist on call if the hospitalist that took care of you is not available. Once you are discharged, your primary care physician will handle any further medical issues. Please note that NO REFILLS for any discharge medications will be authorized once you are discharged, as it is imperative that you return to your primary care physician (or establish a relationship with a primary care physician if you do not have one) for your aftercare needs so that they can reassess your need for medications and monitor your lab values.   Increase activity slowly   Complete by: As directed       Allergies as of 01/15/2023   No Known Allergies      Medication List     TAKE these medications    ondansetron 4 MG tablet Commonly known as: Zofran Take 1 tablet (4 mg total) by mouth daily as needed for nausea or vomiting.        Follow-up Information  [facility] Follow up.   Why: Follow up Contact information: [address] [employer] [address] [place] [address] [phone]        Kinsinger, De Blanch, MD Follow up.   Specialty: General Surgery Contact information: [address] [place] [address] [phone]                No Known Allergies  Consultations: GI General Surgery   Procedures/Studies: CT ABDOMEN PELVIS W CONTRAST  Result Date: 01/10/2023 CLINICAL DATA:  Abdominal pain  EXAM: CT ABDOMEN AND PELVIS WITH CONTRAST TECHNIQUE: Multidetector CT imaging of the abdomen and pelvis was performed using the standard protocol following bolus administration of intravenous contrast. RADIATION DOSE REDUCTION: This exam was performed according to the departmental dose-optimization program which includes automated exposure control, adjustment of the mA and/or kV according to patient size and/or use of iterative reconstruction technique. CONTRAST:  OMNIPAQUE IOHEXOL 300 MG/ML  SOLN COMPARISON:  None Available. FINDINGS: Lower chest: No acute abnormality. Hepatobiliary: Gallstones. No focal hepatic abnormality or biliary dilatation Pancreas: Unremarkable. No pancreatic ductal dilatation or surrounding inflammatory changes. Spleen: Normal in size without focal abnormality. Adrenals/Urinary Tract: Adrenal glands are unremarkable. Kidneys are normal, without renal calculi, focal lesion, or hydronephrosis. Bladder is unremarkable. Stomach/Bowel: Stomach is within normal limits. Appendix appears normal. No evidence of bowel wall thickening, distention, or inflammatory changes. Collapsed appearance of the colon Vascular/Lymphatic: No significant vascular findings are present. No enlarged abdominal or pelvic lymph nodes. Reproductive: Uterus and bilateral adnexa are unremarkable. Other: No abdominal wall hernia or abnormality. No abdominopelvic ascites. Musculoskeletal: No acute or significant osseous findings. IMPRESSION: 1. No CT evidence for acute intra-abdominal or pelvic abnormality. 2. Gallstones. Electronically Signed   By: Jasmine Pang M.D.   On: 01/10/2023 21:27   US Abdomen Limited RUQ (LIVER/GB)  Result Date: 01/10/2023 CLINICAL DATA:  Nausea vomiting abdominal pain. EXAM: ULTRASOUND ABDOMEN LIMITED RIGHT UPPER QUADRANT COMPARISON:  None Available. FINDINGS: Gallbladder: Multiple gallstones. No gallbladder wall thickening or pericholecystic fluid. Negative sonographic Murphy's sign.  Common bile duct: Diameter: 2 mm Liver: No focal lesion identified. Within normal limits in parenchymal echogenicity. Portal vein is patent on color Doppler imaging with normal direction of blood flow towards the liver. Other: None. IMPRESSION: Cholelithiasis without sonographic evidence of acute cholecystitis. Electronically Signed   By: Elgie Collard M.D.   On: 01/10/2023 20:10   DG Chest Port 1 View  Result Date: 01/10/2023 CLINICAL DATA:  Possible sepsis EXAM: PORTABLE CHEST 1 VIEW COMPARISON:  12/15/2011 FINDINGS: The heart size and mediastinal contours are within normal limits. Both lungs are clear. The visualized skeletal structures are unremarkable. IMPRESSION: No active disease. Electronically Signed   By: Jasmine Pang M.D.   On: 01/10/2023 18:30       Discharge Exam: Vitals:   01/14/23 1959 01/15/23 0425  BP: 122/80 120/74  Pulse: (!) 55 (!) 56  Resp: 17 18  Temp: 99 F (37.2 C) 98.5 F (36.9 C)  SpO2: 100% 100%    General: Pt is alert, awake, not in acute distress Cardiovascular: RRR, S1/S2 +, no edema Respiratory: CTA bilaterally, no wheezing, no rhonchi, no respiratory distress, no conversational dyspnea  Abdominal: Soft, NT, ND, bowel sounds + Extremities: no edema, no cyanosis Psych: Normal mood and affect, stable judgement and insight     The results of significant diagnostics from this hospitalization (including imaging, microbiology, ancillary and laboratory) are listed below for reference.  Microbiology: Recent Results (from the past 240 hour(s))  SARS Coronavirus 2 by RT PCR (hospital order, performed in [facility] hospital lab) *cepheid single result test* Anterior Nasal Swab     Status: None   Collection Time: 01/10/23  3:46 PM   Specimen: Anterior Nasal Swab  Result Value Ref Range Status   SARS Coronavirus 2 by RT PCR NEGATIVE NEGATIVE Final    Comment: (NOTE) SARS-CoV-2 target nucleic acids are NOT DETECTED.  The SARS-CoV-2 RNA is generally  detectable in upper and lower respiratory specimens during the acute phase of infection. The lowest concentration of SARS-CoV-2 viral copies this assay can detect is 250 copies / mL. A negative result does not preclude SARS-CoV-2 infection and should not be used as the sole basis for treatment or other patient management decisions.  A negative result may occur with improper specimen collection / handling, submission of specimen other than nasopharyngeal swab, presence of viral mutation(s) within the areas targeted by this assay, and inadequate number of viral copies (<250 copies / mL). A negative result must be combined with clinical observations, patient history, and epidemiological information.  Fact Sheet for Patients:   RoadLapTop.co.za  Fact Sheet for Healthcare Providers: http://kim-miller.com/  This test is not yet approved or  cleared by the [place] FDA and has been authorized for detection and/or diagnosis of SARS-CoV-2 by FDA under an Emergency Use Authorization (EUA).  This EUA will remain in effect (meaning this test can be used) for the duration of the COVID-19 declaration under Section 564(b)(1) of the Act, 21 U.S.C. section 360bbb-3(b)(1), unless the authorization is terminated or revoked sooner.  Performed at [facility], [address], [place], [place] [address]   Resp panel by RT-PCR (RSV, Flu A&B, Covid) Anterior Nasal Swab     Status: None   Collection Time: 01/10/23  3:46 PM   Specimen: Anterior Nasal Swab  Result Value Ref Range Status   SARS Coronavirus 2 by RT PCR NEGATIVE NEGATIVE Final    Comment: (NOTE) SARS-CoV-2 target nucleic acids are NOT DETECTED.  The SARS-CoV-2 RNA is generally detectable in upper respiratory specimens during the acute phase of infection. The lowest concentration of SARS-CoV-2 viral copies this assay can detect is 138 copies/mL. A negative result does not  preclude SARS-Cov-2 infection and should not be used as the sole basis for treatment or other patient management decisions. A negative result may occur with  improper specimen collection/handling, submission of specimen other than nasopharyngeal swab, presence of viral mutation(s) within the areas targeted by this assay, and inadequate number of viral copies(<138 copies/mL). A negative result must be combined with clinical observations, patient history, and epidemiological information. The expected result is Negative.  Fact Sheet for Patients:  BloggerCourse.com  Fact Sheet for Healthcare Providers:  SeriousBroker.it  This test is no t yet approved or cleared by the [place] FDA and  has been authorized for detection and/or diagnosis of SARS-CoV-2 by FDA under an Emergency Use Authorization (EUA). This EUA will remain  in effect (meaning this test can be used) for the duration of the COVID-19 declaration under Section 564(b)(1) of the Act, 21 U.S.C.section 360bbb-3(b)(1), unless the authorization is terminated  or revoked sooner.       Influenza A by PCR NEGATIVE NEGATIVE Final   Influenza B by PCR NEGATIVE NEGATIVE Final    Comment: (NOTE) The Xpert Xpress SARS-CoV-2/FLU/RSV plus assay is intended as an aid in the diagnosis of influenza from Nasopharyngeal swab specimens and should  not be used as a sole basis for treatment. Nasal washings and aspirates are unacceptable for Xpert Xpress SARS-CoV-2/FLU/RSV testing.  Fact Sheet for Patients: BloggerCourse.com  Fact Sheet for Healthcare Providers: SeriousBroker.it  This test is not yet approved or cleared by the [place] FDA and has been authorized for detection and/or diagnosis of SARS-CoV-2 by FDA under an Emergency Use Authorization (EUA). This EUA will remain in effect (meaning this test can be used) for the  duration of the COVID-19 declaration under Section 564(b)(1) of the Act, 21 U.S.C. section 360bbb-3(b)(1), unless the authorization is terminated or revoked.     Resp Syncytial Virus by PCR NEGATIVE NEGATIVE Final    Comment: (NOTE) Fact Sheet for Patients: BloggerCourse.com  Fact Sheet for Healthcare Providers: SeriousBroker.it  This test is not yet approved or cleared by the [place] FDA and has been authorized for detection and/or diagnosis of SARS-CoV-2 by FDA under an Emergency Use Authorization (EUA). This EUA will remain in effect (meaning this test can be used) for the duration of the COVID-19 declaration under Section 564(b)(1) of the Act, 21 U.S.C. section 360bbb-3(b)(1), unless the authorization is terminated or revoked.  Performed at [facility], [address] [place], [place] [address]   Blood Culture (routine x 2)     Status: None   Collection Time: 01/10/23  5:02 PM   Specimen: BLOOD  Result Value Ref Range Status   Specimen Description   Final    BLOOD LEFT ANTECUBITAL Performed at [facility], [address], [place], [place] [address]    Special Requests   Final    BOTTLES DRAWN AEROBIC AND ANAEROBIC Blood Culture adequate volume Performed at [facility], [address], [place], [place] [address]    Culture   Final    NO GROWTH 5 DAYS Performed at [facility], [address] [place], [place] [address]    Report Status 01/15/2023 FINAL  Final  Blood Culture (routine x 2)     Status: None   Collection Time: 01/10/23  5:07 PM   Specimen: BLOOD  Result Value Ref Range Status   Specimen Description   Final    BLOOD BLOOD LEFT FOREARM Performed at [facility], [address] [place], [place] [address]    Special Requests   Final    BOTTLES DRAWN AEROBIC AND ANAEROBIC Blood Culture adequate volume Performed  at [facility], [address] [place], [place] [address]    Culture   Final    NO GROWTH 5 DAYS Performed at [facility], [address], [place], [place] [address]    Report Status 01/15/2023 FINAL  Final  Urine Culture     Status: None   Collection Time: 01/10/23  5:44 PM   Specimen: Urine, Random  Result Value Ref Range Status   Specimen Description   Final    URINE, RANDOM Performed at [facility], [address] [place], [place] [address]    Special Requests   Final    NONE Reflexed from [id number] Performed at [facility], [address], [place], [place] [address]    Culture   Final    NO GROWTH Performed at [facility], [address], [place], [place] [address]    Report Status 01/12/2023 FINAL  Final     Labs: BNP (last 3 results) No results for input(s): "BNP" in the last 8760 hours. Basic Metabolic Panel: Recent Labs  Lab  01/10/23 1744 01/10/23 1756 01/11/23 0525 01/12/23 0011 01/12/23 0818 01/13/23 0348 01/14/23 0431 01/15/23 0354  NA  --    < > 134* 137 131* 137 134* 135  K  --    < > 2.6* 3.9 3.4* 3.5 3.3* 3.8  CL  --   --  97* 104 101 107 105 105  CO2  --   --  26 24 21* 22 20* 18*  GLUCOSE  --   --  106* 92 107* 96 95 85  BUN  --   --  16 9 8  5* <5* <5*  CREATININE  --   --  1.14* 0.85 0.68 0.70 0.78 0.70  CALCIUM  --   --  8.5* 8.9 8.4* 8.7* 8.6* 8.4*  MG 2.7*  --   --   --  2.0  --   --   --   PHOS  --   --  2.5 1.9*  --  2.7  --   --    < > = values in this interval not displayed.   Liver Function Tests: Recent Labs  Lab 01/10/23 1546 01/11/23 0525 01/12/23 0818 01/13/23 0348 01/14/23 0431  AST 109* 58* 36 32 25  ALT 311* 171* 122* 103* 84*  ALKPHOS 69 45 43 43 43  BILITOT 0.9 0.8 0.8 0.8 1.0  PROT 8.8* 5.5* 5.9* 5.5* 5.9*  ALBUMIN 4.9 2.9* 3.1* 2.9* 3.1*   Recent Labs  Lab 01/10/23 1546 01/12/23 0818 01/13/23 0348 01/14/23 0430  LIPASE 261* 370*  436* 322*   No results for input(s): "AMMONIA" in the last 168 hours. CBC: Recent Labs  Lab 01/10/23 1546 01/10/23 1756 01/11/23 0525 01/12/23 0818 01/13/23 0348 01/14/23 0431  WBC 11.5*  --  8.1 6.2 6.4 6.1  HGB 17.5* 18.4* 11.8* 12.5 12.2 12.6  HCT 48.6* 54.0* 34.8* 36.4 36.8 37.2  MCV 87.7  --  92.3 93.8 94.4 91.9  PLT 425*  --  213 198 170 162   Cardiac Enzymes: No results for input(s): "CKTOTAL", "CKMB", "CKMBINDEX", "TROPONINI" in the last 168 hours. BNP: Invalid input(s): "POCBNP" CBG: Recent Labs  Lab 01/14/23 1853  GLUCAP 95   D-Dimer No results for input(s): "DDIMER" in the last 72 hours. Hgb A1c No results for input(s): "HGBA1C" in the last 72 hours. Lipid Profile No results for input(s): "CHOL", "HDL", "LDLCALC", "TRIG", "CHOLHDL", "LDLDIRECT" in the last 72 hours. Thyroid function studies No results for input(s): "TSH", "T4TOTAL", "T3FREE", "THYROIDAB" in the last 72 hours.  Invalid input(s): "FREET3" Anemia work up No results for input(s): "VITAMINB12", "FOLATE", "FERRITIN", "TIBC", "IRON", "RETICCTPCT" in the last 72 hours. Urinalysis    Component Value Date/Time   COLORURINE YELLOW 01/10/2023 1744   APPEARANCEUR CLEAR 01/10/2023 1744   LABSPEC 1.026 01/10/2023 1744   PHURINE 6.5 01/10/2023 1744   GLUCOSEU NEGATIVE 01/10/2023 1744   HGBUR NEGATIVE 01/10/2023 1744   BILIRUBINUR NEGATIVE 01/10/2023 1744   KETONESUR 15 (A) 01/10/2023 1744   PROTEINUR 30 (A) 01/10/2023 1744   UROBILINOGEN 0.2 12/25/2013 2143   NITRITE NEGATIVE 01/10/2023 1744   LEUKOCYTESUR NEGATIVE 01/10/2023 1744   Sepsis Labs Recent Labs  Lab 01/11/23 0525 01/12/23 0818 01/13/23 0348 01/14/23 0431  WBC 8.1 6.2 6.4 6.1   Microbiology Recent Results (from the past 240 hour(s))  SARS Coronavirus 2 by RT PCR (hospital order, performed in [facility] hospital lab) *cepheid single result test* Anterior Nasal Swab     Status: None   Collection Time: 01/10/23  3:46 PM  Specimen: Anterior Nasal Swab  Result Value Ref Range Status   SARS Coronavirus 2 by RT PCR NEGATIVE NEGATIVE Final    Comment: (NOTE) SARS-CoV-2 target nucleic acids are NOT DETECTED.  The SARS-CoV-2 RNA is generally detectable in upper and lower respiratory specimens during the acute phase of infection. The lowest concentration of SARS-CoV-2 viral copies this assay can detect is 250 copies / mL. A negative result does not preclude SARS-CoV-2 infection and should not be used as the sole basis for treatment or other patient management decisions.  A negative result may occur with improper specimen collection / handling, submission of specimen other than nasopharyngeal swab, presence of viral mutation(s) within the areas targeted by this assay, and inadequate number of viral copies (<250 copies / mL). A negative result must be combined with clinical observations, patient history, and epidemiological information.  Fact Sheet for Patients:   RoadLapTop.co.za  Fact Sheet for Healthcare Providers: http://kim-miller.com/  This test is not yet approved or  cleared by the [place] FDA and has been authorized for detection and/or diagnosis of SARS-CoV-2 by FDA under an Emergency Use Authorization (EUA).  This EUA will remain in effect (meaning this test can be used) for the duration of the COVID-19 declaration under Section 564(b)(1) of the Act, 21 U.S.C. section 360bbb-3(b)(1), unless the authorization is terminated or revoked sooner.  Performed at [facility], [address] [place], [place] [address]   Resp panel by RT-PCR (RSV, Flu A&B, Covid) Anterior Nasal Swab     Status: None   Collection Time: 01/10/23  3:46 PM   Specimen: Anterior Nasal Swab  Result Value Ref Range Status   SARS Coronavirus 2 by RT PCR NEGATIVE NEGATIVE Final    Comment: (NOTE) SARS-CoV-2 target nucleic acids are NOT DETECTED.  The  SARS-CoV-2 RNA is generally detectable in upper respiratory specimens during the acute phase of infection. The lowest concentration of SARS-CoV-2 viral copies this assay can detect is 138 copies/mL. A negative result does not preclude SARS-Cov-2 infection and should not be used as the sole basis for treatment or other patient management decisions. A negative result may occur with  improper specimen collection/handling, submission of specimen other than nasopharyngeal swab, presence of viral mutation(s) within the areas targeted by this assay, and inadequate number of viral copies(<138 copies/mL). A negative result must be combined with clinical observations, patient history, and epidemiological information. The expected result is Negative.  Fact Sheet for Patients:  BloggerCourse.com  Fact Sheet for Healthcare Providers:  SeriousBroker.it  This test is no t yet approved or cleared by the [place] FDA and  has been authorized for detection and/or diagnosis of SARS-CoV-2 by FDA under an Emergency Use Authorization (EUA). This EUA will remain  in effect (meaning this test can be used) for the duration of the COVID-19 declaration under Section 564(b)(1) of the Act, 21 U.S.C.section 360bbb-3(b)(1), unless the authorization is terminated  or revoked sooner.       Influenza A by PCR NEGATIVE NEGATIVE Final   Influenza B by PCR NEGATIVE NEGATIVE Final    Comment: (NOTE) The Xpert Xpress SARS-CoV-2/FLU/RSV plus assay is intended as an aid in the diagnosis of influenza from Nasopharyngeal swab specimens and should not be used as a sole basis for treatment. Nasal washings and aspirates are unacceptable for Xpert Xpress SARS-CoV-2/FLU/RSV testing.  Fact Sheet for Patients: BloggerCourse.com  Fact Sheet for Healthcare Providers: SeriousBroker.it  This test is not yet approved or  cleared by the [place]  FDA and has been authorized for detection and/or diagnosis of SARS-CoV-2 by FDA under an Emergency Use Authorization (EUA). This EUA will remain in effect (meaning this test can be used) for the duration of the COVID-19 declaration under Section 564(b)(1) of the Act, 21 U.S.C. section 360bbb-3(b)(1), unless the authorization is terminated or revoked.     Resp Syncytial Virus by PCR NEGATIVE NEGATIVE Final    Comment: (NOTE) Fact Sheet for Patients: BloggerCourse.com  Fact Sheet for Healthcare Providers: SeriousBroker.it  This test is not yet approved or cleared by the [place] FDA and has been authorized for detection and/or diagnosis of SARS-CoV-2 by FDA under an Emergency Use Authorization (EUA). This EUA will remain in effect (meaning this test can be used) for the duration of the COVID-19 declaration under Section 564(b)(1) of the Act, 21 U.S.C. section 360bbb-3(b)(1), unless the authorization is terminated or revoked.  Performed at [facility], [address] [place], [place] [address]   Blood Culture (routine x 2)     Status: None   Collection Time: 01/10/23  5:02 PM   Specimen: BLOOD  Result Value Ref Range Status   Specimen Description   Final    BLOOD LEFT ANTECUBITAL Performed at [facility], [address] [place], [place] [address]    Special Requests   Final    BOTTLES DRAWN AEROBIC AND ANAEROBIC Blood Culture adequate volume Performed at [facility], [address] [place], [place] [address]    Culture   Final    NO GROWTH 5 DAYS Performed at [facility], [address], [place], [place] [address]    Report Status 01/15/2023 FINAL  Final  Blood Culture (routine x 2)     Status: None   Collection Time: 01/10/23  5:07 PM   Specimen: BLOOD  Result Value Ref Range Status   Specimen Description   Final     BLOOD BLOOD LEFT FOREARM Performed at [facility], [address], [place], [place] [address]    Special Requests   Final    BOTTLES DRAWN AEROBIC AND ANAEROBIC Blood Culture adequate volume Performed at [facility], [address] [place], [place] [address]    Culture   Final    NO GROWTH 5 DAYS Performed at [facility], [address] [place], [place] [address]    Report Status 01/15/2023 FINAL  Final  Urine Culture     Status: None   Collection Time: 01/10/23  5:44 PM   Specimen: Urine, Random  Result Value Ref Range Status   Specimen Description   Final    URINE, RANDOM Performed at [facility], [address], [place], [place] [address]    Special Requests   Final    NONE Reflexed from [id number] Performed at [facility], [address] [place], [place] [address]    Culture   Final    NO GROWTH Performed at [facility], [address] [place], [place] [address]    Report Status 01/12/2023 FINAL  Final     Patient was seen and examined on the day of discharge and was found to be in stable condition. Time coordinating discharge: 35 minutes including assessment and coordination of care, as well as examination of the patient.   SIGNED:  Noralee Stain, DO [facility] 01/15/2023, 3:13 PM

## 2023-01-15 NOTE — Plan of Care (Signed)

## 2023-01-15 NOTE — Progress Notes (Signed)
S: some nausea, no new pains O: BP 120/74 (BP Location: Left Arm)   Pulse (!) 56   Temp 98.5 F (36.9 C) (Oral)   Resp 18   Ht 5' (1.524 m)   Wt 72.1 kg   LMP  (LMP Unknown)   SpO2 100%   BMI 31.04 kg/m  Gen: NAD Neuro: AOx4  A/P 22 yo female with nausea, gallstones and lipase elevation -we discussed cholecystectomy and risk reduction. She will think about it but does not want to proceed with any surgery this hospitalization. -I will follow with her in clinic

## 2023-01-15 NOTE — Plan of Care (Signed)
  Problem: Health Behavior/Discharge Planning: Goal: Ability to manage health-related needs will improve Outcome: Progressing   Problem: Clinical Measurements: Goal: Diagnostic test results will improve Outcome: Progressing   Problem: Nutrition: Goal: Adequate nutrition will be maintained Outcome: Progressing

## 2023-02-20 ENCOUNTER — Ambulatory Visit: Payer: Self-pay | Admitting: General Surgery
